# Patient Record
Sex: Female | Born: 1985
Health system: Southern US, Community
[De-identification: ages and names within clinical notes are randomized; demographics above are authoritative.]

## PROBLEM LIST (undated history)

## (undated) DIAGNOSIS — R87629 Unspecified abnormal cytological findings in specimens from vagina: Secondary | ICD-10-CM

## (undated) DIAGNOSIS — F329 Major depressive disorder, single episode, unspecified: Secondary | ICD-10-CM

## (undated) DIAGNOSIS — F419 Anxiety disorder, unspecified: Secondary | ICD-10-CM

## (undated) DIAGNOSIS — Z975 Presence of (intrauterine) contraceptive device: Secondary | ICD-10-CM

## (undated) DIAGNOSIS — G43909 Migraine, unspecified, not intractable, without status migrainosus: Secondary | ICD-10-CM

## (undated) DIAGNOSIS — F32A Depression, unspecified: Secondary | ICD-10-CM

## (undated) DIAGNOSIS — N879 Dysplasia of cervix uteri, unspecified: Secondary | ICD-10-CM

## (undated) HISTORY — DX: Presence of (intrauterine) contraceptive device: Z97.5

## (undated) HISTORY — DX: Dysplasia of cervix uteri, unspecified: N87.9

## (undated) HISTORY — DX: Anxiety disorder, unspecified: F41.9

## (undated) HISTORY — DX: Unspecified abnormal cytological findings in specimens from vagina: R87.629

## (undated) HISTORY — DX: Major depressive disorder, single episode, unspecified: F32.9

## (undated) HISTORY — DX: Depression, unspecified: F32.A

## (undated) HISTORY — DX: Migraine, unspecified, not intractable, without status migrainosus: G43.909

---

## 2005-05-10 DIAGNOSIS — N879 Dysplasia of cervix uteri, unspecified: Secondary | ICD-10-CM

## 2005-05-10 HISTORY — DX: Dysplasia of cervix uteri, unspecified: N87.9

## 2005-05-10 HISTORY — PX: OTHER SURGICAL HISTORY: SHX169

## 2008-05-10 HISTORY — PX: WISDOM TOOTH EXTRACTION: SHX21

## 2013-06-22 ENCOUNTER — Encounter: Payer: Self-pay | Admitting: Family Medicine

## 2013-06-22 ENCOUNTER — Ambulatory Visit (INDEPENDENT_AMBULATORY_CARE_PROVIDER_SITE_OTHER): Payer: Managed Care, Other (non HMO) | Admitting: Family Medicine

## 2013-06-22 VITALS — BP 119/79 | HR 111 | Ht 67.0 in | Wt 160.0 lb

## 2013-06-22 DIAGNOSIS — Z87410 Personal history of cervical dysplasia: Secondary | ICD-10-CM

## 2013-06-22 DIAGNOSIS — N939 Abnormal uterine and vaginal bleeding, unspecified: Secondary | ICD-10-CM

## 2013-06-22 DIAGNOSIS — Z975 Presence of (intrauterine) contraceptive device: Secondary | ICD-10-CM

## 2013-06-22 DIAGNOSIS — Z131 Encounter for screening for diabetes mellitus: Secondary | ICD-10-CM

## 2013-06-22 DIAGNOSIS — Z13220 Encounter for screening for lipoid disorders: Secondary | ICD-10-CM

## 2013-06-22 DIAGNOSIS — R110 Nausea: Secondary | ICD-10-CM

## 2013-06-22 DIAGNOSIS — N898 Other specified noninflammatory disorders of vagina: Secondary | ICD-10-CM

## 2013-06-22 HISTORY — DX: Presence of (intrauterine) contraceptive device: Z97.5

## 2013-06-22 LAB — POCT URINE PREGNANCY: PREG TEST UR: NEGATIVE

## 2013-06-22 MED ORDER — ONDANSETRON HCL 4 MG PO TABS: 4.0000 mg | ORAL_TABLET | Freq: Three times a day (TID) | ORAL | Status: DC | PRN

## 2013-06-22 NOTE — Progress Notes (Signed)
CC: Tommas OlpJessica Leeman is a 28 y.o. female is here for Establish Care and Nausea   Subjective: HPI:  Very pleasant 28 year old here to establish care  Patient reports a history of cervical dysplasia in 2007 that required coned cryotherapy she has had unremarkable Pap smears since then most notably have Pap smears every 6 months x4. Most recent Pap smear was in January 2014 and normal per her report.  Today she complains of nausea of mild severity that has been persistent throughout the day not interfering with appetite. Nothing particular has made it better or worse. Described only as upset stomach without any abdominal pain whatsoever. Denies any vomiting, diarrhea, constipation, fevers, chills nor rashes. Denies any genitourinary complaints other than below. This is also been accompanied by headache that is mild in severity localized to the forehead nothing which makes better or worse took Aleve this morning however no other interventions. Denies any motor sensory disturbances accompanying this  She mentions that she has had vaginal spotting lasting 3-5 days once a month ever since her Mirena was placed last year. It is predictable and not interfering with her quality of life.   Review Of Systems Outlined In HPI  Past Medical History  Diagnosis Date  . Dysplasia of cervix 2007    Past Surgical History  Procedure Laterality Date  . Cesarean section  2006,2007    x 2  . Wisdom tooth extraction  2010  . Procedure for dysplasia  2007   Family History  Problem Relation Age of Onset  . Cancer Maternal Grandmother   . Heart attack      parent  . Diabetes      parents  . Hypertension      paternal grandmother  . Hyperlipidemia      paternal granmother    History   Social History  . Marital Status: Married    Spouse Name: N/A    Number of Children: N/A  . Years of Education: N/A   Occupational History  . Not on file.   Social History Main Topics  . Smoking status: Never Smoker    . Smokeless tobacco: Not on file  . Alcohol Use: No  . Drug Use: No  . Sexual Activity: Yes    Partners: Male   Other Topics Concern  . Not on file   Social History Narrative  . No narrative on file     Objective: BP 119/79  Pulse 111  Ht 5\' 7"  (1.702 m)  Wt 160 lb (72.576 kg)  BMI 25.05 kg/m2  General: Alert and Oriented, No Acute Distress HEENT: Pupils equal, round, reactive to light. Conjunctivae clear.  External ears unremarkable, canals clear with intact TMs with appropriate landmarks.  Middle ear appears open without effusion. Pink inferior turbinates.  Moist mucous membranes, pharynx without inflammation nor lesions.  Neck supple without palpable lymphadenopathy nor abnormal masses. Neuro: Cranial nerves II through XII grossly intact Lungs: Clear to auscultation bilaterally, no wheezing/ronchi/rales.  Comfortable work of breathing. Good air movement. Cardiac: Regular rate and rhythm. Normal S1/S2.  No murmurs, rubs, nor gallops.   Mental Status: No depression, anxiety, nor agitation. Skin: Warm and dry.  Assessment & Plan: [name] was seen today for establish care and nausea.  Diagnoses and associated orders for this visit:  Vaginal bleeding - POCT urine pregnancy  History of cervical dysplasia  IUD (intrauterine device) in place  Lipid screening - Lipid panel  Nausea alone - ondansetron (ZOFRAN) 4 MG tablet; Take 1-2 tablets (4-8  mg total) by mouth every 8 (eight) hours as needed for nausea or vomiting.  Diabetes mellitus screening - BASIC METABOLIC PANEL WITH GFR    Vaginal bleeding in the setting of nausea process to get a urine pregnancy test today that was negative. We discussed that with the Mirena is not uncommon for women to have unpredictable or predictable vaginal bleeding. I do not feel this is a sign of needing any further workup other than a Pap smear when she returns for her annual exam. Nausea: Discuss symptomatically her including Zofran  one dose she received here in clinic 8 mg, prescription to use at home.  She's going to get basic metabolic panel for further workup. Headache: Given ibuprofen here 800 mg, continue this every 8 hours as needed call if any new symptoms develop over the weekend or on Monday. Signs and symptoms requring emergent/urgent reevaluation were discussed with the patient.    Return if symptoms worsen or fail to improve, for PRN Annual Exam.

## 2013-06-25 LAB — LIPID PANEL
Cholesterol: 143 mg/dL (ref 0–200)
HDL: 38 mg/dL — AB (ref >39)
LDL CALC: 95 mg/dL (ref 0–99)
Total CHOL/HDL Ratio: 3.8 Ratio
Triglycerides: 50 mg/dL (ref <150)
VLDL: 10 mg/dL (ref 0–40)

## 2013-06-25 LAB — BASIC METABOLIC PANEL WITH GFR
BUN: 11 mg/dL (ref 6–23)
CO2: 26 meq/L (ref 19–32)
Calcium: 9.2 mg/dL (ref 8.4–10.5)
Chloride: 108 mEq/L (ref 96–112)
Creat: 0.81 mg/dL (ref 0.50–1.10)
GFR, Est African American: >89 mL/min
GFR, Est Non African American: >89 mL/min
Glucose, Bld: 89 mg/dL (ref 70–99)
Potassium: 4 mEq/L (ref 3.5–5.3)
Sodium: 143 mEq/L (ref 135–145)

## 2013-06-27 ENCOUNTER — Encounter: Payer: Self-pay | Admitting: Family Medicine

## 2013-07-02 ENCOUNTER — Encounter: Payer: Self-pay | Admitting: Family Medicine

## 2013-07-02 DIAGNOSIS — Z9289 Personal history of other medical treatment: Secondary | ICD-10-CM | POA: Insufficient documentation

## 2013-07-13 ENCOUNTER — Encounter: Payer: Self-pay | Admitting: Family Medicine

## 2013-07-13 ENCOUNTER — Other Ambulatory Visit (HOSPITAL_COMMUNITY)
Admission: RE | Admit: 2013-07-13 | Discharge: 2013-07-13 | Disposition: A | Discharge status: Home / Self Care | Payer: Managed Care, Other (non HMO) | Source: Ambulatory Visit | Attending: Family Medicine | Admitting: Family Medicine

## 2013-07-13 ENCOUNTER — Ambulatory Visit (INDEPENDENT_AMBULATORY_CARE_PROVIDER_SITE_OTHER): Payer: Managed Care, Other (non HMO) | Admitting: Family Medicine

## 2013-07-13 VITALS — BP 115/75 | HR 87 | Resp 16 | Wt 159.0 lb

## 2013-07-13 DIAGNOSIS — Z0000 Encounter for general adult medical examination without abnormal findings: Secondary | ICD-10-CM

## 2013-07-13 DIAGNOSIS — Z205 Contact with and (suspected) exposure to viral hepatitis: Secondary | ICD-10-CM

## 2013-07-13 DIAGNOSIS — Z87410 Personal history of cervical dysplasia: Secondary | ICD-10-CM

## 2013-07-13 DIAGNOSIS — Z01419 Encounter for gynecological examination (general) (routine) without abnormal findings: Secondary | ICD-10-CM | POA: Insufficient documentation

## 2013-07-13 DIAGNOSIS — Z23 Encounter for immunization: Secondary | ICD-10-CM

## 2013-07-13 NOTE — Patient Instructions (Signed)
Dr. Breyon Sigg's General Advice Following Your Complete Physical Exam  The Benefits of Regular Exercise: Unless you suffer from an uncontrolled cardiovascular condition, studies strongly suggest that regular exercise and physical activity will add to both the quality and length of your life.  The World Health Organization recommends 150 minutes of moderate intensity aerobic activity every week.  This is best split over 3-4 days a week, and can be as simple as a brisk walk for just over 35 minutes "most days of the week".  This type of exercise has been shown to lower LDL-Cholesterol, lower average blood sugars, lower blood pressure, lower cardiovascular disease risk, improve memory, and increase one's overall sense of wellbeing.  The addition of anaerobic (or "strength training") exercises offers additional benefits including but not limited to increased metabolism, prevention of osteoporosis, and improved overall cholesterol levels.  How Can I Strive For A Low-Fat Diet?: Current guidelines recommend that 25-35 percent of your daily energy (food) intake should come from fats.  One might ask how can this be achieved without having to dissect each meal on a daily basis?  Switch to skim or 1% milk instead of whole milk.  Focus on lean meats such as ground turkey, fresh fish, baked chicken, and lean cuts of beef as your source of dietary protein.  Limit saturated fat consumption to less than 10% of your daily caloric intake.  Limit trans fatty acid consumption primarily by limiting synthetic trans fats such as partially hydrogenated oils (Ex: fried fast foods).  Substitute olive or vegetable oil for solid fats where possible.  Moderation of Salt Intake: Provided you don't carry a diagnosis of congestive heart failure nor renal failure, I recommend a daily allowance of no more than 2300 mg of salt (sodium).  Keeping under this daily goal is associated with a decreased risk of cardiovascular events, creeping  above it can lead to elevated blood pressures and increases your risk of cardiovascular events.  Milligrams (mg) of salt is listed on all nutrition labels, and your daily intake can add up faster than you think.  Most canned and frozen dinners can pack in over half your daily salt allowance in one meal.    Lifestyle Health Risks: Certain lifestyle choices carry specific health risks.  As you may already know, tobacco use has been associated with increasing one's risk of cardiovascular disease, pulmonary disease, numerous cancers, among many other issues.  What you may not know is that there are medications and nicotine replacement strategies that can more than double your chances of successfully quitting.  I would be thrilled to help manage your quitting strategy if you currently use tobacco products.  When it comes to alcohol use, I've yet to find an "ideal" daily allowance.  Provided an individual does not have a medical condition that is exacerbated by alcohol consumption, general guidelines determine "safe drinking" as no more than two standard drinks for a man or no more than one standard drink for a female per day.  However, much debate still exists on whether any amount of alcohol consumption is technically "safe".  My general advice, keep alcohol consumption to a minimum for general health promotion.  If you or others believe that alcohol, tobacco, or recreational drug use is interfering with your life, I would be happy to provide confidential counseling regarding treatment options.  General "Over The Counter" Nutrition Advice: Postmenopausal women should aim for a daily calcium intake of 1200 mg, however a significant portion of this might already be   provided by diets including milk, yogurt, cheese, and other dairy products.  Vitamin D has been shown to help preserve bone density, prevent fatigue, and has even been shown to help reduce falls in the elderly.  Ensuring a daily intake of 800 Units of  Vitamin D is a good place to start to enjoy the above benefits, we can easily check your Vitamin D level to see if you'd potentially benefit from supplementation beyond 800 Units a day.  Folic Acid intake should be of particular concern to women of childbearing age.  Daily consumption of 400-800 mcg of Folic Acid is recommended to minimize the chance of spinal cord defects in a fetus should pregnancy occur.    For many adults, accidents still remain one of the most common culprits when it comes to cause of death.  Some of the simplest but most effective preventitive habits you can adopt include regular seatbelt use, proper helmet use, securing firearms, and regularly testing your smoke and carbon monoxide detectors.  Mary Mcpherson B. Mary Marengo DO [facility] [address], [address] [place], [place] [address] Phone: [phone]  

## 2013-07-13 NOTE — Progress Notes (Signed)
CC: Mary Mcpherson is a 28 y.o. 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 She does not know of any exposure to infected patient's blood or bodily secretions. The exposure has only been skin the skin or handling papers that have been touched by the above. She denies any specific symptoms attributed to concerns for hepatitis.  Exercises most days of the week tries to watch what she eats rare alcohol use no tobacco or recreational drug use  Review of Systems - General ROS: negative for - chills, fever, night sweats, weight gain or weight loss Ophthalmic ROS: negative for - decreased vision Psychological ROS: negative for - anxiety or depression ENT ROS: negative for - hearing change, nasal congestion, tinnitus or allergies Hematological and Lymphatic ROS: negative for - bleeding problems, bruising or swollen lymph nodes Breast ROS: negative Respiratory ROS: no cough, shortness of breath, or wheezing Cardiovascular ROS: no chest pain or dyspnea on exertion Gastrointestinal ROS: no abdominal pain, change in bowel habits, or black or bloody stools Genito-Urinary ROS: negative for - genital discharge, genital ulcers, incontinence or abnormal bleeding from genitals Musculoskeletal ROS: negative for - joint pain or muscle pain Neurological ROS: negative for - headaches or memory loss Dermatological ROS: negative for lumps, mole changes, rash and skin lesion changes   Past Medical History  Diagnosis Date  . Dysplasia of cervix 2007    Past Surgical History   Procedure Laterality Date  . Cesarean section  2006,2007    x 2  . Wisdom tooth extraction  2010  . Procedure for dysplasia  2007   Family History  Problem Relation Age of Onset  . Cancer Maternal Grandmother   . Heart attack      parent  . Diabetes      parents  . Hypertension      paternal grandmother  . Hyperlipidemia      paternal granmother    History   Social History  . Marital Status: Married    Spouse Name: N/A    Number of Children: N/A  . Years of Education: N/A   Occupational History  . Not on file.   Social History Main Topics  . Smoking status: Never Smoker   . Smokeless tobacco: Not on file  . Alcohol Use: No  . Drug Use: No  . Sexual Activity: Yes    Partners: Male   Other Topics Concern  . Not on file   Social History Narrative  . No narrative on file     Objective: BP 115/75  Pulse 87  Resp 16  Wt 159 lb (72.122 kg)  SpO2 99%  General: No Acute Distress HEENT: Atraumatic, normocephalic, conjunctivae normal without scleral icterus.  No nasal discharge, hearing grossly intact, TMs with good landmarks bilaterally with no middle ear abnormalities, posterior pharynx clear without oral lesions. Neck: Supple, trachea midline, no cervical nor supraclavicular adenopathy. Pulmonary: Clear to auscultation bilaterally without wheezing, rhonchi, nor rales. Cardiac: Regular rate and rhythm.  No murmurs, rubs, nor gallops. No peripheral edema.  2+ peripheral pulses bilaterally. Abdomen: Bowel sounds normal.  No  masses.  Non-tender without rebound.  Negative Murphy's sign. GU: Labia majora & minora without lesions.  Distal urethra unremarkable.  Vaginal wall integrity preserved without mucosal lesions.  Cervix non-tender and without gross lesions nor discharge.  Mirena strings in place exiting external cervical os MSK: Grossly intact, no signs of weakness.  Full strength throughout upper and lower extremities.  Full ROM in upper and lower extremities.  No  midline spinal tenderness. Neuro: Gait unremarkable, CN II-XII grossly intact.  C5-C6 Reflex 2/4 Bilaterally, L4 Reflex 2/4 Bilaterally.  Cerebellar function intact. Skin: No rashes. Psych: Alert and oriented to person/place/time.  Thought process normal. No anxiety/depression.   Assessment & Plan: Mary Mcpherson was seen today for annual exam.  Diagnoses and associated orders for this visit:  Annual physical exam  Need for Tdap vaccination - Tdap vaccine greater than or equal to 7yo IM  History of cervical dysplasia  Exposure to viral hepatitis - Hepatitis B Surface AntiBODY - Hepatitis C antibody  Encounter for routine gynecological examination - Cytology - PAP    Healthy lifestyle interventions including but not limited to regular exercise, a healthy low fat diet, moderation of salt intake, the dangers of tobacco/alcohol/recreational drug use, nutrition supplementation, and accident avoidance were discussed with the patient and a handout was provided for future reference. Pap smear obtained without difficulty Her concerns we will check for evidence of hepatitis C infection and preservation of hepatitis B immunization   Return in about 1 year (around 07/14/2014) for Annual Exams.

## 2013-07-14 LAB — HEPATITIS B SURFACE ANTIBODY,QUALITATIVE: Hep B S Ab: POSITIVE — AB

## 2013-07-14 LAB — HEPATITIS C ANTIBODY: HCV Ab: NEGATIVE

## 2014-03-01 ENCOUNTER — Encounter: Payer: Self-pay | Admitting: Family Medicine

## 2014-03-01 ENCOUNTER — Ambulatory Visit (INDEPENDENT_AMBULATORY_CARE_PROVIDER_SITE_OTHER): Payer: Managed Care, Other (non HMO) | Admitting: Family Medicine

## 2014-03-01 VITALS — BP 119/87 | HR 102 | Temp 97.8°F | Wt 164.0 lb

## 2014-03-01 DIAGNOSIS — B029 Zoster without complications: Secondary | ICD-10-CM

## 2014-03-01 DIAGNOSIS — F411 Generalized anxiety disorder: Secondary | ICD-10-CM | POA: Insufficient documentation

## 2014-03-01 HISTORY — DX: Zoster without complications: B02.9

## 2014-03-01 MED ORDER — LIDOCAINE 5 % EX OINT: 1.0000 "application " | TOPICAL_OINTMENT | CUTANEOUS | Status: DC | PRN

## 2014-03-01 MED ORDER — ACYCLOVIR 400 MG PO TABS: 800.0000 mg | ORAL_TABLET | Freq: Every day | ORAL | Status: DC

## 2014-03-01 MED ORDER — PREDNISONE 20 MG PO TABS: ORAL_TABLET | ORAL | Status: AC

## 2014-03-01 NOTE — Progress Notes (Signed)
CC: Mary Mcpherson is a 28 y.o. female is here for rash on abd and Nausea   Subjective: HPI:  Patient complains of a rash on the abdomen has been present for since Tuesday of this week. It was initially itchy however now it is painful to touch. Nothing else seems to make it better or worse. It seems to be spreading around her flank and has lesions on the back. This is accompanied by fever, abdominal discomfort, sore throat that began 2 days prior to the outbreak of the rash however these have now resolved without any intervention. She denies skin lesions elsewhere.  Complains of moderate nervousness and anxiety for reasons she can't identify. This is been going on ever since one of her coworkers got in trouble for calling in prescriptions for Xanax using Verdis's name as a Engineer, civil (consulting)nurse. The judicial system and her administrators have not place any blame on [name] and there have been no repercussions for her however she is embarrassed and is afraid to have this linked to her for the rest of her life. She reports the anxiousness and nervousness has been present on a daily basis, having a short fuse, and causing her to get emotional and crying over simple stressful episodes. Since August it has not gotten better or worse. She denies any other mental disturbance, dissatisfaction with life, depression or sleep disturbance  Review Of Systems Outlined In HPI  Past Medical History  Diagnosis Date  . Dysplasia of cervix 2007    Past Surgical History  Procedure Laterality Date  . Cesarean section  2006,2007    x 2  . Wisdom tooth extraction  2010  . Procedure for dysplasia  2007   Family History  Problem Relation Age of Onset  . Cancer Maternal Grandmother   . Heart attack      parent  . Diabetes      parents  . Hypertension      paternal grandmother  . Hyperlipidemia      paternal granmother    History   Social History  . Marital Status: Married    Spouse Name: N/A    Number of Children: N/A   . Years of Education: N/A   Occupational History  . Not on file.   Social History Main Topics  . Smoking status: Never Smoker   . Smokeless tobacco: Not on file  . Alcohol Use: No  . Drug Use: No  . Sexual Activity: Yes    Partners: Male   Other Topics Concern  . Not on file   Social History Narrative  . No narrative on file     Objective: BP 119/87  Pulse 102  Temp(Src) 97.8 F (36.6 C) (Oral)  Wt 164 lb (74.39 kg)  General: Alert and Oriented, No Acute Distress HEENT: Pupils equal, round, reactive to light. Conjunctivae clear.  Moist membranes pharynx unremarkable Lungs: Clear to auscultation bilaterally, no wheezing/ronchi/rales.  Comfortable work of breathing. Good air movement. Cardiac: Regular rate and rhythm. Normal S1/S2.  No murmurs, rubs, nor gallops.   Abdomen: Soft nontender Extremities: No peripheral edema.  Strong peripheral pulses.  Mental Status: No depression, anxiety, nor agitation. Skin: Warm and dry. Clusters of fluid-filled vesicles in the approximate T8 dermatome on the left extending from the abdomen wrapped around the flank to the back. Tender to the touch  Assessment & Plan: [name] was seen today for rash on abd and nausea.  Diagnoses and associated orders for this visit:  Shingles - acyclovir (ZOVIRAX) 400  MG tablet; Take 2 tablets (800 mg total) by mouth 5 (five) times daily. - predniSONE (DELTASONE) 20 MG tablet; Three tabs daily days 1-3, two tabs daily days 4-6, one tab daily days 7-9, half tab daily days 10-13. - lidocaine (XYLOCAINE) 5 % ointment; Apply 1 application topically as needed.  Generalized anxiety disorder    Shingles: Start acyclovir and prednisone taper. Lidocaine ointment as needed for pain. She declines any pain medication today beyond this Generalized anxiety disorder: Offered SNRI/SSRI today however she would prefer to avoid taking medications at all possible and would rather take a wait and see approach.  Return  if symptoms worsen or fail to improve.

## 2014-03-04 ENCOUNTER — Ambulatory Visit: Payer: Managed Care, Other (non HMO) | Admitting: Family Medicine

## 2014-07-29 ENCOUNTER — Encounter: Payer: Self-pay | Admitting: Family Medicine

## 2014-07-29 ENCOUNTER — Ambulatory Visit (INDEPENDENT_AMBULATORY_CARE_PROVIDER_SITE_OTHER): Payer: Managed Care, Other (non HMO) | Admitting: Family Medicine

## 2014-07-29 ENCOUNTER — Other Ambulatory Visit (HOSPITAL_COMMUNITY)
Admission: RE | Admit: 2014-07-29 | Discharge: 2014-07-29 | Disposition: A | Discharge status: Home / Self Care | Payer: Managed Care, Other (non HMO) | Source: Ambulatory Visit | Attending: Family Medicine | Admitting: Family Medicine

## 2014-07-29 VITALS — BP 115/69 | HR 80 | Ht 67.0 in | Wt 173.0 lb

## 2014-07-29 DIAGNOSIS — Z1151 Encounter for screening for human papillomavirus (HPV): Secondary | ICD-10-CM | POA: Insufficient documentation

## 2014-07-29 DIAGNOSIS — Z01419 Encounter for gynecological examination (general) (routine) without abnormal findings: Secondary | ICD-10-CM | POA: Diagnosis present

## 2014-07-29 DIAGNOSIS — Z124 Encounter for screening for malignant neoplasm of cervix: Secondary | ICD-10-CM

## 2014-07-29 DIAGNOSIS — Z0000 Encounter for general adult medical examination without abnormal findings: Secondary | ICD-10-CM

## 2014-07-29 LAB — LIPID PANEL
Cholesterol: 173 mg/dL (ref 0–200)
HDL: 52 mg/dL (ref >=46)
LDL Cholesterol: 110 mg/dL — ABNORMAL HIGH (ref 0–99)
Total CHOL/HDL Ratio: 3.3 Ratio
Triglycerides: 57 mg/dL (ref <150)
VLDL: 11 mg/dL (ref 0–40)

## 2014-07-29 LAB — CBC
HEMATOCRIT: 38.9 % (ref 36.0–46.0)
HEMOGLOBIN: 13.5 g/dL (ref 12.0–15.0)
MCH: 31.5 pg (ref 26.0–34.0)
MCHC: 34.7 g/dL (ref 30.0–36.0)
MCV: 90.7 fL (ref 78.0–100.0)
MPV: 10.4 fL (ref 8.6–12.4)
Platelets: 267 10*3/uL (ref 150–400)
RBC: 4.29 MIL/uL (ref 3.87–5.11)
RDW: 13.3 % (ref 11.5–15.5)
WBC: 5.1 10*3/uL (ref 4.0–10.5)

## 2014-07-29 LAB — COMPREHENSIVE METABOLIC PANEL
ALBUMIN: 4.6 g/dL (ref 3.5–5.2)
ALT: 11 U/L (ref 0–35)
AST: 13 U/L (ref 0–37)
Alkaline Phosphatase: 49 U/L (ref 39–117)
BILIRUBIN TOTAL: 0.7 mg/dL (ref 0.2–1.2)
BUN: 14 mg/dL (ref 6–23)
CO2: 26 mEq/L (ref 19–32)
Calcium: 9.3 mg/dL (ref 8.4–10.5)
Chloride: 107 mEq/L (ref 96–112)
Creat: 0.78 mg/dL (ref 0.50–1.10)
Glucose, Bld: 84 mg/dL (ref 70–99)
POTASSIUM: 4.1 meq/L (ref 3.5–5.3)
SODIUM: 140 meq/L (ref 135–145)
TOTAL PROTEIN: 7.2 g/dL (ref 6.0–8.3)

## 2014-07-29 NOTE — Progress Notes (Signed)
CC: Mary Mcpherson is a 29 y.o. female is here for Annual Exam   Subjective: HPI:  Colonoscopy: No indication yet Papsmear: She has a history of CIN in 2007 with no abnormal since, however she would like to have a Pap smear today Mammogram: no current indication  Influenza Vaccine: Declines today Pneumovax: No current indication Td/Tdap:UTD as of last year Zoster: (Start 30 yo)  Rare alcohol use no tobacco or recreational drug use  Review of Systems - General ROS: negative for - chills, fever, night sweats, weight gain or weight loss Ophthalmic ROS: negative for - decreased vision Psychological ROS: negative for - anxiety or depression ENT ROS: negative for - hearing change, nasal congestion, tinnitus or allergies Hematological and Lymphatic ROS: negative for - bleeding problems, bruising or swollen lymph nodes Breast ROS: negative Respiratory ROS: no cough, shortness of breath, or wheezing Cardiovascular ROS: no chest pain or dyspnea on exertion Gastrointestinal ROS: no abdominal pain, change in bowel habits, or black or bloody stools Genito-Urinary ROS: negative for - genital discharge, genital ulcers, incontinence or abnormal bleeding from genitals Musculoskeletal ROS: negative for - joint pain or muscle pain Neurological ROS: negative for - headaches or memory loss Dermatological ROS: negative for lumps, mole changes, rash and skin lesion changes  Past Medical History  Diagnosis Date  . Dysplasia of cervix 2007    Past Surgical History  Procedure Laterality Date  . Cesarean section  2006,2007    x 2  . Wisdom tooth extraction  2010  . Procedure for dysplasia  2007   Family History  Problem Relation Age of Onset  . Cancer Maternal Grandmother   . Heart attack      parent  . Diabetes      parents  . Hypertension      paternal grandmother  . Hyperlipidemia      paternal granmother    History   Social History  . Marital Status: Married    Spouse Name: N/A  .  Number of Children: N/A  . Years of Education: N/A   Occupational History  . Not on file.   Social History Main Topics  . Smoking status: Never Smoker   . Smokeless tobacco: Not on file  . Alcohol Use: No  . Drug Use: No  . Sexual Activity:    Partners: Male   Other Topics Concern  . Not on file   Social History Narrative     Objective: BP 115/69 mmHg  Pulse 80  Ht '5\' 7"'  (1.702 m)  Wt 173 lb (78.472 kg)  BMI 27.09 kg/m2  General: No Acute Distress HEENT: Atraumatic, normocephalic, conjunctivae normal without scleral icterus.  No nasal discharge, hearing grossly intact, TMs with good landmarks bilaterally with no middle ear abnormalities, posterior pharynx clear without oral lesions. Neck: Supple, trachea midline, no cervical nor supraclavicular adenopathy. Pulmonary: Clear to auscultation bilaterally without wheezing, rhonchi, nor rales. Cardiac: Regular rate and rhythm.  No murmurs, rubs, nor gallops. No peripheral edema.  2+ peripheral pulses bilaterally. Abdomen: Bowel sounds normal.  No masses.  Non-tender without rebound.  Negative Murphy's sign. GU:  Labia majora & minora without lesions.  Distal urethra unremarkable.  Vaginal wall integrity preserved without mucosal lesions.  Cervix non-tender and without gross lesions nor discharge.  IUD strings easily seen at cervical os. MSK: Grossly intact, no signs of weakness.  Full strength throughout upper and lower extremities.  Full ROM in upper and lower extremities.  No midline spinal tenderness. Neuro: Gait unremarkable,  CN II-XII grossly intact.  C5-C6 Reflex 2/4 Bilaterally, L4 Reflex 2/4 Bilaterally.  Cerebellar function intact. Skin: No rashes. Psych: Alert and oriented to person/place/time.  Thought process normal. No anxiety/depression.   Assessment & Plan: Mary Mcpherson was seen today for annual exam.  Diagnoses and all orders for this visit:  Annual physical exam Orders: -     Lipid panel -     Comp Met (CMET) -      CBC  Screening for cervical cancer   Healthy lifestyle interventions including but not limited to regular exercise, a healthy low fat diet, moderation of salt intake, the dangers of tobacco/alcohol/recreational drug use, nutrition supplementation, and accident avoidance were discussed with the patient and a handout was provided for future reference.  Return in about 1 year (around 07/29/2015). j

## 2014-07-29 NOTE — Patient Instructions (Signed)
Dr. Mitsuye Schrodt's General Advice Following Your Complete Physical Exam  The Benefits of Regular Exercise: Unless you suffer from an uncontrolled cardiovascular condition, studies strongly suggest that regular exercise and physical activity will add to both the quality and length of your life.  The World Health Organization recommends 150 minutes of moderate intensity aerobic activity every week.  This is best split over 3-4 days a week, and can be as simple as a brisk walk for just over 35 minutes "most days of the week".  This type of exercise has been shown to lower LDL-Cholesterol, lower average blood sugars, lower blood pressure, lower cardiovascular disease risk, improve memory, and increase one's overall sense of wellbeing.  The addition of anaerobic (or "strength training") exercises offers additional benefits including but not limited to increased metabolism, prevention of osteoporosis, and improved overall cholesterol levels.  How Can I Strive For A Low-Fat Diet?: Current guidelines recommend that 25-35 percent of your daily energy (food) intake should come from fats.  One might ask how can this be achieved without having to dissect each meal on a daily basis?  Switch to skim or 1% milk instead of whole milk.  Focus on lean meats such as ground turkey, fresh fish, baked chicken, and lean cuts of beef as your source of dietary protein.  Limit saturated fat consumption to less than 10% of your daily caloric intake.  Limit trans fatty acid consumption primarily by limiting synthetic trans fats such as partially hydrogenated oils (Ex: fried fast foods).  Substitute olive or vegetable oil for solid fats where possible.  Moderation of Salt Intake: Provided you don't carry a diagnosis of congestive heart failure nor renal failure, I recommend a daily allowance of no more than 2300 mg of salt (sodium).  Keeping under this daily goal is associated with a decreased risk of cardiovascular events, creeping  above it can lead to elevated blood pressures and increases your risk of cardiovascular events.  Milligrams (mg) of salt is listed on all nutrition labels, and your daily intake can add up faster than you think.  Most canned and frozen dinners can pack in over half your daily salt allowance in one meal.    Lifestyle Health Risks: Certain lifestyle choices carry specific health risks.  As you may already know, tobacco use has been associated with increasing one's risk of cardiovascular disease, pulmonary disease, numerous cancers, among many other issues.  What you may not know is that there are medications and nicotine replacement strategies that can more than double your chances of successfully quitting.  I would be thrilled to help manage your quitting strategy if you currently use tobacco products.  When it comes to alcohol use, I've yet to find an "ideal" daily allowance.  Provided an individual does not have a medical condition that is exacerbated by alcohol consumption, general guidelines determine "safe drinking" as no more than two standard drinks for a man or no more than one standard drink for a female per day.  However, much debate still exists on whether any amount of alcohol consumption is technically "safe".  My general advice, keep alcohol consumption to a minimum for general health promotion.  If you or others believe that alcohol, tobacco, or recreational drug use is interfering with your life, I would be happy to provide confidential counseling regarding treatment options.  General "Over The Counter" Nutrition Advice: Postmenopausal women should aim for a daily calcium intake of 1200 mg, however a significant portion of this might already be   provided by diets including milk, yogurt, cheese, and other dairy products.  Vitamin D has been shown to help preserve bone density, prevent fatigue, and has even been shown to help reduce falls in the elderly.  Ensuring a daily intake of 800 Units of  Vitamin D is a good place to start to enjoy the above benefits, we can easily check your Vitamin D level to see if you'd potentially benefit from supplementation beyond 800 Units a day.  Folic Acid intake should be of particular concern to women of childbearing age.  Daily consumption of 400-800 mcg of Folic Acid is recommended to minimize the chance of spinal cord defects in a fetus should pregnancy occur.    For many adults, accidents still remain one of the most common culprits when it comes to cause of death.  Some of the simplest but most effective preventitive habits you can adopt include regular seatbelt use, proper helmet use, securing firearms, and regularly testing your smoke and carbon monoxide detectors.  Mary Shrieves B. Elleah Hemsley DO [facility] [address], [address] [place], [place] [address] Phone: [phone]  

## 2014-07-29 NOTE — Addendum Note (Signed)
Addended by: Wyline BeadyMCCRIMMON, Keiara Sneeringer C on: 07/29/2014 09:44 AM   Modules accepted: Orders

## 2014-07-30 LAB — CYTOLOGY - PAP

## 2014-07-31 ENCOUNTER — Telehealth: Payer: Self-pay | Admitting: *Deleted

## 2014-07-31 NOTE — Telephone Encounter (Signed)
Patient called in this AM with complaint of UTI symptoms that has progressed overnight. Since leaving message she has picked some OTC products. Patient will call if no relief.

## 2014-11-05 ENCOUNTER — Encounter: Payer: Self-pay | Admitting: Family Medicine

## 2014-11-05 MED ORDER — ESCITALOPRAM OXALATE 10 MG PO TABS: 10.0000 mg | ORAL_TABLET | Freq: Every day | ORAL | Status: DC

## 2014-11-06 MED ORDER — HYDROXYZINE HCL 25 MG PO TABS: 25.0000 mg | ORAL_TABLET | Freq: Every day | ORAL | Status: DC | PRN

## 2014-11-06 NOTE — Addendum Note (Signed)
Addended by: Laren BoomHOMMEL, Jaisean Monteforte on: 11/06/2014 08:12 AM   Modules accepted: Orders, Medications

## 2014-11-27 ENCOUNTER — Encounter: Payer: Self-pay | Admitting: Family Medicine

## 2015-01-14 ENCOUNTER — Encounter: Payer: Self-pay | Admitting: Family Medicine

## 2015-01-15 MED ORDER — VALACYCLOVIR HCL 1 G PO TABS: 1000.0000 mg | ORAL_TABLET | Freq: Three times a day (TID) | ORAL | Status: DC

## 2015-04-14 ENCOUNTER — Ambulatory Visit (INDEPENDENT_AMBULATORY_CARE_PROVIDER_SITE_OTHER): Payer: Managed Care, Other (non HMO) | Admitting: Family Medicine

## 2015-04-14 ENCOUNTER — Encounter: Payer: Self-pay | Admitting: Family Medicine

## 2015-04-14 VITALS — BP 124/79 | HR 74 | Wt 175.0 lb

## 2015-04-14 DIAGNOSIS — G43819 Other migraine, intractable, without status migrainosus: Secondary | ICD-10-CM

## 2015-04-14 DIAGNOSIS — L679 Hair color and hair shaft abnormality, unspecified: Secondary | ICD-10-CM

## 2015-04-14 DIAGNOSIS — G43909 Migraine, unspecified, not intractable, without status migrainosus: Secondary | ICD-10-CM | POA: Insufficient documentation

## 2015-04-14 MED ORDER — METHYLPREDNISOLONE ACETATE 80 MG/ML IJ SUSP
80.0000 mg | Freq: Once | INTRAMUSCULAR | Status: AC
  Administered 2015-04-14: 80 mg via INTRAMUSCULAR

## 2015-04-14 MED ORDER — PROMETHAZINE HCL 25 MG/ML IJ SOLN
25.0000 mg | Freq: Once | INTRAMUSCULAR | Status: AC
  Administered 2015-04-14: 25 mg via INTRAMUSCULAR

## 2015-04-14 MED ORDER — KETOROLAC TROMETHAMINE 60 MG/2ML IM SOLN
60.0000 mg | Freq: Once | INTRAMUSCULAR | Status: AC
  Administered 2015-04-14: 60 mg via INTRAMUSCULAR

## 2015-04-14 NOTE — Progress Notes (Signed)
CC: Mary Mcpherson is a [age] y.o. female is here for Migraine   Subjective: HPI:  Frontal headache which is pounding with photophobia and nausea that's been present since Thursday night. It was at its worst Friday afternoon and evening. It's now subsided to a moderate degree and present throughout the day. Other than above nothing seems to make it better or worse. No benefit from Excedrin or ibuprofen. She tells me feels like her history of chronic headaches however the duration of this was significantly longer. She denies worse headache of her life or waking up due to the headache. Denies fevers, chills, sore throat, motor or sensory disturbances or rash.  Last week she also noticed that her hair has been much more oily than she's used to. Denies itch or pain of the scalp. Symptoms improved with using dish soap on her scalp.   Review Of Systems Outlined In HPI  Past Medical History  Diagnosis Date  . Dysplasia of cervix 2007    Past Surgical History  Procedure Laterality Date  . Cesarean section  2006,2007    x 2  . Wisdom tooth extraction  2010  . Procedure for dysplasia  2007   Family History  Problem Relation Age of Onset  . Cancer Maternal Grandmother   . Heart attack      parent  . Diabetes      parents  . Hypertension      paternal grandmother  . Hyperlipidemia      paternal granmother    Social History   Social History  . Marital Status: Married    Spouse Name: N/A  . Number of Children: N/A  . Years of Education: N/A   Occupational History  . Not on file.   Social History Main Topics  . Smoking status: Never Smoker   . Smokeless tobacco: Not on file  . Alcohol Use: No  . Drug Use: No  . Sexual Activity:    Partners: Male   Other Topics Concern  . Not on file   Social History Narrative     Objective: BP 124/79 mmHg  Pulse 74  Wt 175 lb (79.379 kg)  General: Alert and Oriented, No Acute Distress HEENT: Pupils equal, round, reactive to light.  Conjunctivae clear.  Moist mucous membranes unremarkable Lungs: clear and comfortable work of breathing Cardiac: Regular rate and rhythm.  Extremities: No peripheral edema.  Strong peripheral pulses.  Mental Status: No depression, anxiety, nor agitation. Skin: Warm and dry. Neuro: CN II-XII grossly intact, full strength/rom of all four extremities,  gait normal, rapid alternating movements normal, heel-shin test normal, Rhomberg normal.  Assessment & Plan: Mary Mcpherson was seen today for migraine.  Diagnoses and all orders for this visit:  Other migraine without status migrainosus, intractable  Hair changes   Migraine: We'll try abortive therapy with tramadol, promethazine and Depo-Medrol.Signs and symptoms requring emergent/urgent reevaluation were discussed with the patient. Hair changes: Discussed trying Selsun Blue if symptoms persist, using dish soap on a daily basis will likely lead to scalp dryness and irritation.  Return if symptoms worsen or fail to improve.

## 2015-04-14 NOTE — Addendum Note (Signed)
Addended by: Thom ChimesHENRY, Edithe Dobbin M on: 04/14/2015 02:35 PM   Modules accepted: Orders

## 2015-04-23 ENCOUNTER — Encounter: Payer: Self-pay | Admitting: Family Medicine

## 2015-04-23 MED ORDER — ESCITALOPRAM OXALATE 5 MG PO TABS: ORAL_TABLET | ORAL | Status: DC

## 2015-05-23 ENCOUNTER — Ambulatory Visit: Payer: Managed Care, Other (non HMO) | Admitting: Family Medicine

## 2015-06-27 ENCOUNTER — Ambulatory Visit (INDEPENDENT_AMBULATORY_CARE_PROVIDER_SITE_OTHER): Payer: Managed Care, Other (non HMO) | Admitting: Family Medicine

## 2015-06-27 ENCOUNTER — Encounter: Payer: Self-pay | Admitting: Family Medicine

## 2015-06-27 VITALS — BP 137/91 | HR 126 | Temp 99.0°F | Wt 170.0 lb

## 2015-06-27 DIAGNOSIS — F411 Generalized anxiety disorder: Secondary | ICD-10-CM | POA: Diagnosis not present

## 2015-06-27 DIAGNOSIS — J189 Pneumonia, unspecified organism: Secondary | ICD-10-CM | POA: Diagnosis not present

## 2015-06-27 MED ORDER — LEVOFLOXACIN 500 MG PO TABS: 500.0000 mg | ORAL_TABLET | Freq: Every day | ORAL | Status: DC

## 2015-06-27 MED ORDER — ESCITALOPRAM OXALATE 5 MG PO TABS: ORAL_TABLET | ORAL | Status: DC

## 2015-06-27 MED ORDER — HYDROCODONE-HOMATROPINE 5-1.5 MG/5ML PO SYRP: 5.0000 mL | ORAL_SOLUTION | Freq: Four times a day (QID) | ORAL | Status: DC | PRN

## 2015-06-27 NOTE — Progress Notes (Signed)
CC: Mary Mcpherson is a 30 y.o. female is here for URI   Subjective: HPI:  2 weeks of worsening cough. No benefit from NyQuil, no other interventions as of yet. Is productive and occurring all hours of the day. Keeps her up at night. She feels fatigued and has even felt some fevers but no chills or night sweats. She denies any pain with breathing. She denies any wheezing but does have some mild shortness of breath with exertion. Denies any facial pressure or sore throat. No rash or confusion.  Her anxiety has worsened over the last few hours because she recently got the news that she's been fired from her job. She is having crying spells and nervousness about not knowing what to do for employment. Denies any depression.   Review Of Systems Outlined In HPI  Past Medical History  Diagnosis Date  . Dysplasia of cervix 2007    Past Surgical History  Procedure Laterality Date  . Cesarean section  2006,2007    x 2  . Wisdom tooth extraction  2010  . Procedure for dysplasia  2007   Family History  Problem Relation Age of Onset  . Cancer Maternal Grandmother   . Heart attack      parent  . Diabetes      parents  . Hypertension      paternal grandmother  . Hyperlipidemia      paternal granmother    Social History   Social History  . Marital Status: Married    Spouse Name: N/A  . Number of Children: N/A  . Years of Education: N/A   Occupational History  . Not on file.   Social History Main Topics  . Smoking status: Never Smoker   . Smokeless tobacco: Not on file  . Alcohol Use: No  . Drug Use: No  . Sexual Activity:    Partners: Male   Other Topics Concern  . Not on file   Social History Narrative     Objective: BP 137/91 mmHg  Pulse 126  Temp(Src) 99 F (37.2 C) (Oral)  Wt 170 lb (77.111 kg)  SpO2 98%  General: Alert and Oriented, No Acute Distress HEENT: Pupils equal, round, reactive to light. Conjunctivae clear.  External ears unremarkable, canals clear  with intact TMs with appropriate landmarks.  Middle ear appears open without effusion. Pink inferior turbinates.  Moist mucous membranes, pharynx without inflammation nor lesions.  Neck supple without palpable lymphadenopathy nor abnormal masses. Lungs: clearing comfortable work of breathing however there are diminished lung sounds in the right posterior inferior lung field.no wheezing or rhonchi Cardiac: Regular rate and rhythm. Normal S1/S2.  No murmurs, rubs, nor gallops.   Extremities: No peripheral edema.  Strong peripheral pulses.  Mental Status: No depression, anxiety, nor agitation. Skin: Warm and dry.  Assessment & Plan: Kourtnee was seen today for uri.  Diagnoses and all orders for this visit:  Generalized anxiety disorder  CAP (community acquired pneumonia)  Other orders -     levofloxacin (LEVAQUIN) 500 MG tablet; Take 1 tablet (500 mg total) by mouth daily. -     HYDROcodone-homatropine (HYCODAN) 5-1.5 MG/5ML syrup; Take 5 mLs by mouth every 6 (six) hours as needed for cough. -     escitalopram (LEXAPRO) 5 MG tablet; Half tablet daily, if headaches continue may take up to a full tablet daily.  Denies anxiety disorder worsened due to recent job loss. She stopped her Lexapro a few days ago, I did encourage her to  restart it today until she finds a future employer. High suspicion for community acquired pneumonia therefore start levofloxacin and as needed Hycodan for cough.Signs and symptoms requring emergent/urgent reevaluation were discussed with the patient.  Return if symptoms worsen or fail to improve.

## 2015-07-03 ENCOUNTER — Encounter: Payer: Self-pay | Admitting: Family Medicine

## 2015-07-03 DIAGNOSIS — R05 Cough: Secondary | ICD-10-CM

## 2015-07-03 DIAGNOSIS — R059 Cough, unspecified: Secondary | ICD-10-CM

## 2015-07-03 MED ORDER — PREDNISONE 20 MG PO TABS: ORAL_TABLET | ORAL | Status: AC

## 2015-07-03 MED ORDER — AZITHROMYCIN 250 MG PO TABS: ORAL_TABLET | ORAL | Status: AC

## 2015-07-30 ENCOUNTER — Other Ambulatory Visit (HOSPITAL_COMMUNITY)
Admission: RE | Admit: 2015-07-30 | Discharge: 2015-07-30 | Disposition: A | Discharge status: Home / Self Care | Payer: Managed Care, Other (non HMO) | Source: Ambulatory Visit | Attending: Family Medicine | Admitting: Family Medicine

## 2015-07-30 ENCOUNTER — Encounter: Payer: Self-pay | Admitting: Family Medicine

## 2015-07-30 ENCOUNTER — Ambulatory Visit (INDEPENDENT_AMBULATORY_CARE_PROVIDER_SITE_OTHER): Payer: Managed Care, Other (non HMO) | Admitting: Family Medicine

## 2015-07-30 VITALS — BP 116/73 | HR 70 | Wt 170.0 lb

## 2015-07-30 DIAGNOSIS — Z87410 Personal history of cervical dysplasia: Secondary | ICD-10-CM

## 2015-07-30 DIAGNOSIS — Z01419 Encounter for gynecological examination (general) (routine) without abnormal findings: Secondary | ICD-10-CM | POA: Diagnosis present

## 2015-07-30 DIAGNOSIS — Z1151 Encounter for screening for human papillomavirus (HPV): Secondary | ICD-10-CM | POA: Insufficient documentation

## 2015-07-30 DIAGNOSIS — Z0000 Encounter for general adult medical examination without abnormal findings: Secondary | ICD-10-CM

## 2015-07-30 LAB — COMPLETE METABOLIC PANEL WITH GFR
ALT: 11 U/L (ref 6–29)
AST: 13 U/L (ref 10–30)
Albumin: 4.4 g/dL (ref 3.6–5.1)
Alkaline Phosphatase: 46 U/L (ref 33–115)
BUN: 11 mg/dL (ref 7–25)
CALCIUM: 9.4 mg/dL (ref 8.6–10.2)
CHLORIDE: 107 mmol/L (ref 98–110)
CO2: 24 mmol/L (ref 20–31)
Creat: 0.74 mg/dL (ref 0.50–1.10)
GFR, Est Non African American: >89 mL/min (ref >=60)
Glucose, Bld: 79 mg/dL (ref 65–99)
POTASSIUM: 4 mmol/L (ref 3.5–5.3)
Sodium: 141 mmol/L (ref 135–146)
Total Bilirubin: 0.5 mg/dL (ref 0.2–1.2)
Total Protein: 7.1 g/dL (ref 6.1–8.1)

## 2015-07-30 LAB — CBC
HEMATOCRIT: 37.9 % (ref 36.0–46.0)
HEMOGLOBIN: 13 g/dL (ref 12.0–15.0)
MCH: 31.6 pg (ref 26.0–34.0)
MCHC: 34.3 g/dL (ref 30.0–36.0)
MCV: 92.2 fL (ref 78.0–100.0)
MPV: 10.1 fL (ref 8.6–12.4)
Platelets: 290 10*3/uL (ref 150–400)
RBC: 4.11 MIL/uL (ref 3.87–5.11)
RDW: 13.6 % (ref 11.5–15.5)
WBC: 3.8 10*3/uL — ABNORMAL LOW (ref 4.0–10.5)

## 2015-07-30 LAB — LIPID PANEL
CHOL/HDL RATIO: 3.5 ratio (ref <=5.0)
CHOLESTEROL: 173 mg/dL (ref 125–200)
HDL: 49 mg/dL (ref >=46)
LDL CALC: 114 mg/dL (ref <130)
TRIGLYCERIDES: 48 mg/dL (ref <150)
VLDL: 10 mg/dL (ref <30)

## 2015-07-30 NOTE — Progress Notes (Signed)
CC: Mary Mcpherson is a 30 y.o. female is here for Gynecologic Exam   Subjective: HPI:  Colonoscopy: No current indication Papsmear: History of abnormal pap, she elects to get a pap annually. Mammogram: No current indication  Influenza Vaccine: declined Pneumovax: No current indication Td/Tdap: UTD Zoster: (Start 31 yo)  Requesting complete physical exam with no complaints other than requesting a Pap smear.  Review of Systems - General ROS: negative for - chills, fever, night sweats, weight gain or weight loss Ophthalmic ROS: negative for - decreased vision Psychological ROS: negative for - anxiety or depression ENT ROS: negative for - hearing change, nasal congestion, tinnitus or allergies Hematological and Lymphatic ROS: negative for - bleeding problems, bruising or swollen lymph nodes Breast ROS: negative Respiratory ROS: no cough, shortness of breath, or wheezing Cardiovascular ROS: no chest pain or dyspnea on exertion Gastrointestinal ROS: no abdominal pain, change in bowel habits, or black or bloody stools Genito-Urinary ROS: negative for - genital discharge, genital ulcers, incontinence or abnormal bleeding from genitals Musculoskeletal ROS: negative for - joint pain or muscle pain Neurological ROS: negative for - headaches or memory loss Dermatological ROS: negative for lumps, mole changes, rash and skin lesion changes  Past Medical History  Diagnosis Date  . Dysplasia of cervix 2007    Past Surgical History  Procedure Laterality Date  . Cesarean section  2006,2007    x 2  . Wisdom tooth extraction  2010  . Procedure for dysplasia  2007   Family History  Problem Relation Age of Onset  . Cancer Maternal Grandmother   . Heart attack      parent  . Diabetes      parents  . Hypertension      paternal grandmother  . Hyperlipidemia      paternal granmother    Social History   Social History  . Marital Status: Married    Spouse Name: N/A  . Number of  Children: N/A  . Years of Education: N/A   Occupational History  . Not on file.   Social History Main Topics  . Smoking status: Never Smoker   . Smokeless tobacco: Not on file  . Alcohol Use: No  . Drug Use: No  . Sexual Activity:    Partners: Male   Other Topics Concern  . Not on file   Social History Narrative     Objective: BP 116/73 mmHg  Pulse 70  Wt 170 lb (77.111 kg)  General: No Acute Distress HEENT: Atraumatic, normocephalic, conjunctivae normal without scleral icterus.  No nasal discharge, hearing grossly intact, TMs with good landmarks bilaterally with no middle ear abnormalities, posterior pharynx clear without oral lesions. Neck: Supple, trachea midline, no cervical nor supraclavicular adenopathy. Pulmonary: Clear to auscultation bilaterally without wheezing, rhonchi, nor rales. Cardiac: Regular rate and rhythm.  No murmurs, rubs, nor gallops. No peripheral edema.  2+ peripheral pulses bilaterally. Abdomen: Bowel sounds normal.  No masses.  Non-tender without rebound.  Negative Murphy's sign. GU:  Labia majora & minora without lesions.  Distal urethra unremarkable.  Vaginal wall integrity preserved without mucosal lesions.  Cervix non-tender and without gross lesions nor discharge.   MSK: Grossly intact, no signs of weakness.  Full strength throughout upper and lower extremities.  Full ROM in upper and lower extremities.  No midline spinal tenderness. Neuro: Gait unremarkable, CN II-XII grossly intact.  C5-C6 Reflex 2/4 Bilaterally, L4 Reflex 2/4 Bilaterally.  Cerebellar function intact. Skin: No rashes. Psych: Alert and oriented to person/place/time.  Thought process normal. No anxiety/depression.  Assessment & Plan: [name] was seen today for gynecologic exam.  Diagnoses and all orders for this visit:  Annual physical exam -     Lipid panel -     COMPLETE METABOLIC PANEL WITH GFR -     CBC -     Cytology - PAP  History of cervical dysplasia -      Cytology - PAP   Healthy lifestyle interventions including but not limited to regular exercise, a healthy low fat diet, moderation of salt intake, the dangers of tobacco/alcohol/recreational drug use, nutrition supplementation, and accident avoidance were discussed with the patient and a handout was provided for future reference.  Return if symptoms worsen or fail to improve.

## 2015-07-31 LAB — CYTOLOGY - PAP

## 2016-04-08 ENCOUNTER — Ambulatory Visit (INDEPENDENT_AMBULATORY_CARE_PROVIDER_SITE_OTHER): Payer: BLUE CROSS/BLUE SHIELD | Admitting: Family Medicine

## 2016-04-08 ENCOUNTER — Encounter: Payer: Self-pay | Admitting: Family Medicine

## 2016-04-08 VITALS — BP 117/74 | HR 102 | Temp 98.7°F | Ht 67.0 in | Wt 177.0 lb

## 2016-04-08 DIAGNOSIS — J029 Acute pharyngitis, unspecified: Secondary | ICD-10-CM | POA: Diagnosis not present

## 2016-04-08 LAB — POCT RAPID STREP A (OFFICE): RAPID STREP A SCREEN: NEGATIVE

## 2016-04-08 NOTE — Progress Notes (Signed)
   Subjective:    Patient ID: Tommas OlpJessica Nauta, female    DOB: 11/12/1985, 30 y.o.   MRN: [id number]  HPI 30 year old female comes in today complaining of feeling sick for about 2 days. She complains of bilateral ear pain though worse on the left. Her throat initially felt scratchy but actually feels more like it's on fire today. She is getting sharp pains with swallowing in both years. She's been taking over-the-counter NyQuil, Tylenol cold and flu.    Review of Systems     Objective:   Physical Exam  Constitutional: She is oriented to person, place, and time. She appears well-developed and well-nourished.  HENT:  Head: Normocephalic and atraumatic.  Right Ear: External ear normal.  Left Ear: External ear normal.  Nose: Nose normal.  Mouth/Throat: Oropharynx is clear and moist.  TMs and canals are clear.   Eyes: Conjunctivae and EOM are normal. Pupils are equal, round, and reactive to light.  Neck: Neck supple. No thyromegaly present.  Cardiovascular: Normal rate, regular rhythm and normal heart sounds.   Pulmonary/Chest: Effort normal and breath sounds normal. She has no wheezes.  Lymphadenopathy:    She has no cervical adenopathy.  Neurological: She is alert and oriented to person, place, and time.  Skin: Skin is warm and dry.  Psychiatric: She has a normal mood and affect.       Assessment & Plan:  Viral Pharyngitis - Strep test negative. Gave reassurance. Recommend salt water gargles and lozenges and Chloraseptic spray. If not feeling better by Monday or getting worse and please let us know. Okay to continue over-the-counter cough and cold medication.

## 2016-04-08 NOTE — Patient Instructions (Addendum)

## 2016-05-24 ENCOUNTER — Ambulatory Visit (INDEPENDENT_AMBULATORY_CARE_PROVIDER_SITE_OTHER): Payer: BLUE CROSS/BLUE SHIELD | Admitting: Osteopathic Medicine

## 2016-05-24 ENCOUNTER — Encounter: Payer: Self-pay | Admitting: Osteopathic Medicine

## 2016-05-24 VITALS — BP 117/73 | HR 102 | Wt 176.0 lb

## 2016-05-24 DIAGNOSIS — F411 Generalized anxiety disorder: Secondary | ICD-10-CM | POA: Diagnosis not present

## 2016-05-24 DIAGNOSIS — Z87410 Personal history of cervical dysplasia: Secondary | ICD-10-CM | POA: Diagnosis not present

## 2016-05-24 DIAGNOSIS — G43819 Other migraine, intractable, without status migrainosus: Secondary | ICD-10-CM

## 2016-05-24 DIAGNOSIS — S39012A Strain of muscle, fascia and tendon of lower back, initial encounter: Secondary | ICD-10-CM

## 2016-05-24 DIAGNOSIS — Z975 Presence of (intrauterine) contraceptive device: Secondary | ICD-10-CM

## 2016-05-24 DIAGNOSIS — E663 Overweight: Secondary | ICD-10-CM

## 2016-05-24 NOTE — Patient Instructions (Signed)
Weight loss: important things to remember  It is hard work! You will have setbacks, but don't get discouraged. The goal is not short-term success, it is long-term health.   Looking at the numbers is important to track your progress and set goals, but how you are feeling and your overall health are the most important things! BMI and pounds and calories and miles logged aren't everything - they are tools to help us reach your goals.  You can do this!!!   Things to remember for exercise for weight loss:   Please note - I am not a certified personal trainer. I can present you with ideas and general workout goals, but an exercise program is largely up to you. Find something you can stick with, and something you enjoy!   As you progress in your exercise regimen think about gradually increasing the following, week by week:   intensity (how strenuous is your workout)  frequency (how often you are exercising)  duration (how many minutes at a time you are exercising)  Walking for 20 minutes a day is certainly better than nothing, but more strenuous exercise will develop better cardiovascular fitness.   interval training (high-intensity alternating with low-intensity, think walk/jog rather than just walk)  muscle strengthening exercises (weight lifting, calisthenics, yoga) - this also helps prevent osteoporosis!   Things to remember for diet changes for weight loss:   Please note - I am not a certified dietician. I can present you with ideas and general diet goals, but a meal plan is largely up to you. I am happy to refer you to a dietician who can give you a detailed meal plan.  Apps/logs are crucial to track how you're eating! It's not realistic to be logging everything you eat forever, but when you're starting a healthy eating lifestyle it's very helpful, and checking in with logs now and then helps you stick to your program!   Calorie restriction with the goal weight loss of no more than one  to one and a half pounds per week.   Increase lean protein such as chicken, fish, turkey.   Decrease fatty foods such as dairy, butter.   Decrease sugary foods. Avoid sugary drinks such as soda or juice.  Increase fiber found in fruit and vegetables.   

## 2016-05-24 NOTE — Progress Notes (Signed)
HPI: Mary Mcpherson is a 31 y.o. female  who presents to [facility] [place] today, 05/24/16,  for chief complaint of:  Chief Complaint  Patient presents with  . Establish Care    SWITCH FROM HOMMEL   Back pain: new problem . Location: lower left . Quality: soreness . Timing: started this morning, no triggering injury, maybe worse when getting out of car . Context: hx twisting injury few years ago that flares up like this now and then . Modifying factors: ibuprofen typically helps . Assoc signs/symptoms: no numbness/shooting pain   Other medical history reviewed   Generalized anxiety disorder: weaned herself lexapro and hydroxyzine. Has started taking magnesium which seems to have helped - few months, 500 mg capsule per day   History of cervical dysplasia: prefers paps annually  Migraine: no problems with the headaches, previously stress induced and has been doing well since no longer working as stressful a job.   IUD (intrauterine device) in place: considering removal to try for pregnancy but still undecided at this time       Past medical, surgical, social and family history reviewed: Patient Active Problem List   Diagnosis Date Noted  . Migraine 04/14/2015  . Shingles 03/01/2014  . Generalized anxiety disorder 03/01/2014  . History of positive PPD 07/02/2013  . History of cervical dysplasia 06/22/2013  . IUD (intrauterine device) in place 06/22/2013   Past Surgical History:  Procedure Laterality Date  . CESAREAN SECTION  2006,2007   x 2  . procedure for dysplasia  2007  . WISDOM TOOTH EXTRACTION  2010   Social History  Substance Use Topics  . Smoking status: Never Smoker  . Smokeless tobacco: Not on file  . Alcohol use No   Family History  Problem Relation Age of Onset  . Cancer Maternal Grandmother   . Heart attack      parent  . Diabetes      parents  . Hypertension      paternal grandmother  . Hyperlipidemia       paternal granmother     Current medication list and allergy/intolerance information reviewed:   Current Outpatient Prescriptions on File Prior to Visit  Medication Sig Dispense Refill  . levonorgestrel (MIRENA) 20 MCG/24HR IUD 1 each by Intrauterine route once.    . Prenatal Multivit-Min-Fe-FA (PRENATAL VITAMINS PO) Take by mouth.     No current facility-administered medications on file prior to visit.    No Known Allergies    Review of Systems:  Constitutional: No recent illness  HEENT: No  headache, no vision change  Cardiac: No  chest pain, No  pressure, No palpitations  Respiratory:  No  shortness of breath. No  Cough  Gastrointestinal: No  abdominal pain, no change on bowel habits  Musculoskeletal: +new myalgia/arthralgia  Skin: No  Rash  Hem/Onc: No  easy bruising/bleeding, No  abnormal lumps/bumps  Neurologic: No  weakness, No  Dizziness  Psychiatric: No  concerns with depression, No  concerns with anxiety  Exam:  BP 117/73   Pulse (!) 102   Wt 176 lb (79.8 kg)   BMI 27.57 kg/m   Constitutional: VS see above. General Appearance: alert, well-developed, well-nourished, NAD  Eyes: Normal lids and conjunctive, non-icteric sclera  Ears, Nose, Mouth, Throat: MMM, Normal external inspection ears/nares/mouth/lips/gums.  Neck: No masses, trachea midline.   Respiratory: Normal respiratory effort. no wheeze, no rhonchi, no rales  Cardiovascular: S1/S2 normal, no murmur, no rub/gallop auscultated. RRR.   Musculoskeletal:  Gait normal. Symmetric and independent movement of all extremities. (+)pain on R lumbar area w/ forward flexion, restricted back extension, nontender, neg straight leg raise bilaterally   Neurological: Normal balance/coordination. No tremor.  Skin: warm, dry, intact.   Psychiatric: Normal judgment/insight. Normal mood and affect. Oriented x3.     ASSESSMENT/PLAN:   Lumbar strain, initial encounter - conservative treatment, home exercises  given, PT referral if no better  Generalized anxiety disorder - plan to check Mag with labs, pt doing well on no Rx  History of cervical dysplasia - plan for pap  Other migraine without status migrainosus, intractable - resolved with decreased stress, will monitor prn  IUD (intrauterine device) in place - advised removal when desired/5 years after insertion  Overweight - nonobese - discussion of lifestyle interventions for healthy weight loss     Visit summary with medication list and pertinent instructions was printed for patient to review. All questions at time of visit were answered - patient instructed to contact office with any additional concerns. ER/RTC precautions were reviewed with the patient. Follow-up plan: [facility] DUE, sooner if needed.  Note: Total time spent 30 minutes, greater than 50% of the visit was spent face-to-face counseling and coordinating care for the following: The primary encounter diagnosis was Lumbar strain, initial encounter. Diagnoses of Generalized anxiety disorder, History of cervical dysplasia, Other migraine without status migrainosus, intractable, IUD (intrauterine device) in place, and Overweight were also pertinent to this visit..Marland Kitchen

## 2016-06-08 ENCOUNTER — Encounter: Payer: Self-pay | Admitting: Osteopathic Medicine

## 2016-06-09 ENCOUNTER — Encounter: Payer: Self-pay | Admitting: Osteopathic Medicine

## 2016-06-09 ENCOUNTER — Ambulatory Visit (INDEPENDENT_AMBULATORY_CARE_PROVIDER_SITE_OTHER): Payer: BLUE CROSS/BLUE SHIELD | Admitting: Osteopathic Medicine

## 2016-06-09 VITALS — BP 114/70 | HR 84 | Temp 98.2°F | Ht 67.0 in | Wt 175.0 lb

## 2016-06-09 DIAGNOSIS — J208 Acute bronchitis due to other specified organisms: Secondary | ICD-10-CM

## 2016-06-09 DIAGNOSIS — B9789 Other viral agents as the cause of diseases classified elsewhere: Secondary | ICD-10-CM | POA: Diagnosis not present

## 2016-06-09 DIAGNOSIS — J069 Acute upper respiratory infection, unspecified: Secondary | ICD-10-CM | POA: Diagnosis not present

## 2016-06-09 MED ORDER — AZITHROMYCIN 250 MG PO TABS: ORAL_TABLET | ORAL | 0 refills | Status: DC

## 2016-06-09 MED ORDER — BENZONATATE 200 MG PO CAPS: 200.0000 mg | ORAL_CAPSULE | Freq: Three times a day (TID) | ORAL | 0 refills | Status: DC | PRN

## 2016-06-09 NOTE — Progress Notes (Signed)
HPI: Mary Mcpherson is a 31 y.o. female who presents to [facility] 06/09/16 for chief complaint of:  Chief Complaint  Patient presents with  . Generalized Body Aches    Acute Illness: . Context: mother in law recently having flu symptoms (no official diagnosis) . Location:  . Quality: started with some congestion and sinus issues, getting worse over the course of the past week. (+)coughing quite a bit.  . Assoc signs/symptoms: see ROS - no fever (has been measuring temperature) . Duration: 7 days . Modifying factors: has tried the following OTC/Rx medications: has tried generic sudafed which helps some but not much    Past medical, social and family history reviewed. Current medications and allergies reviewed.     Review of Systems:  Constitutional: No  fever/chills  HEENT: Yes  headache, No  sore throat, No  swollen glands  Cardiovascular: No chest pain  Respiratory:Yes  cough, No  shortness of breath  Gastrointestinal: No  nausea, No  vomiting,  No  diarrhea  Musculoskeletal:   No  myalgia/arthralgia  Skin/Integument:  No  rash   Exam:  BP 114/70   Pulse 84   Temp 98.2 F (36.8 C) (Oral)   Ht 5\' 7"  (1.702 m)   Wt 175 lb (79.4 kg)   BMI 27.41 kg/m   Constitutional: VSS, see above. General Appearance: alert, well-developed, well-nourished, NAD  Eyes: Normal lids and conjunctive, non-icteric sclera, PERRLA  Ears, Nose, Mouth, Throat: Normal external inspection ears/nares/mouth/lips/gums, normal TM, MMM; posterior pharynx without erythema, without exudate, nasal mucosa normal  Neck: No masses, trachea midline. normal lymph nodes  Respiratory: Normal respiratory effort. No  wheeze/rhonchi/rales  Cardiovascular: S1/S2 normal, no murmur/rub/gallop auscultated. RRR.      ASSESSMENT/PLAN: maximize OTC medication treatment, fill abx if no better 1-3 days or if worse. RTC if severe cough/fever/SOB.   Viral URI with  cough  Viral bronchitis     Patient Instructions  Note: the following list assumes no pregnancy, normal liver & kidney function and no other drug interactions. Dr. Lyn Hollingshead has highlighted medications which are safe for you to use, but these may not be appropriate for everyone. Always ask a pharmacist or qualified medical provider if there are any questions!    Aches/Pains, Fever Acetaminophen (Tylenol) 500 mg tablets - take max 2 tablets (1000 mg) every 6 hours (4 times per day)  Ibuprofen (Motrin) 200 mg tablets - take max 4 tablets (800 mg) every 6 hours  Sinus Congestion Prescription Atrovent Cromolyn Nasal Spray (NasalCrom) 1 spray each nostril 3-4 times per day, max 6 imes per day Nasal Saline if desired Oxymetolazone (Afrin, others) sparing use due to rebound congestion Phenylephrine (Sudafed) 10 mg tablets every 4 hours (or the 12-hour formulation) Diphenhydramine (Benadryl) 25 mg tablets - take max 2 tablets every 4 hours  Cough & Sore Throat Prescription cough pills or syrups Dextromethorphan (Robitussin, others) - cough suppressant Guaifenesin (Robitussin, Mucinex, others) - expectorant (helps cough up mucus) (Dextromethorphan and Guaifenesin also come in a combination tablet) Lozenges w/ Benzocaine + Menthol (Cepacol) Honey - as much as you want! Teas which "coat the throat" - look for ingredients Elm Bark, Licorice Root, Marshmallow Root  Other Zinc Lozenges within 24 hours of symptoms onset - mixed evidence this shortens the duration of the common cold Don't waste your money on Vitamin C or Echinacea       Visit summary was printed for the patient with medications and pertinent instructions for patient to  review. ER/RTC precautions reviewed. All questions answered. Return if symptoms worsen or fail to improve.

## 2016-06-09 NOTE — Patient Instructions (Signed)
Note: the following list assumes no pregnancy, normal liver & kidney function and no other drug interactions. Dr. Kwinton Maahs has highlighted medications which are safe for you to use, but these may not be appropriate for everyone. Always ask a pharmacist or qualified medical provider if there are any questions!    Aches/Pains, Fever Acetaminophen (Tylenol) 500 mg tablets - take max 2 tablets (1000 mg) every 6 hours (4 times per day)  Ibuprofen (Motrin) 200 mg tablets - take max 4 tablets (800 mg) every 6 hours  Sinus Congestion Prescription Atrovent Cromolyn Nasal Spray (NasalCrom) 1 spray each nostril 3-4 times per day, max 6 imes per day Nasal Saline if desired Oxymetolazone (Afrin, others) sparing use due to rebound congestion Phenylephrine (Sudafed) 10 mg tablets every 4 hours (or the 12-hour formulation) Diphenhydramine (Benadryl) 25 mg tablets - take max 2 tablets every 4 hours  Cough & Sore Throat Prescription cough pills or syrups Dextromethorphan (Robitussin, others) - cough suppressant Guaifenesin (Robitussin, Mucinex, others) - expectorant (helps cough up mucus) (Dextromethorphan and Guaifenesin also come in a combination tablet) Lozenges w/ Benzocaine + Menthol (Cepacol) Honey - as much as you want! Teas which "coat the throat" - look for ingredients Elm Bark, Licorice Root, Marshmallow Root  Other Zinc Lozenges within 24 hours of symptoms onset - mixed evidence this shortens the duration of the common cold Don't waste your money on Vitamin C or Echinacea    

## 2016-06-28 ENCOUNTER — Encounter: Payer: Self-pay | Admitting: Osteopathic Medicine

## 2016-06-28 ENCOUNTER — Other Ambulatory Visit (HOSPITAL_COMMUNITY)
Admission: RE | Admit: 2016-06-28 | Discharge: 2016-06-28 | Disposition: A | Discharge status: Home / Self Care | Payer: BLUE CROSS/BLUE SHIELD | Source: Ambulatory Visit | Attending: Osteopathic Medicine | Admitting: Osteopathic Medicine

## 2016-06-28 ENCOUNTER — Ambulatory Visit (INDEPENDENT_AMBULATORY_CARE_PROVIDER_SITE_OTHER): Payer: BLUE CROSS/BLUE SHIELD | Admitting: Osteopathic Medicine

## 2016-06-28 VITALS — BP 103/63 | HR 75 | Ht 67.0 in | Wt 174.0 lb

## 2016-06-28 DIAGNOSIS — Z01419 Encounter for gynecological examination (general) (routine) without abnormal findings: Secondary | ICD-10-CM | POA: Insufficient documentation

## 2016-06-28 DIAGNOSIS — Z1151 Encounter for screening for human papillomavirus (HPV): Secondary | ICD-10-CM | POA: Diagnosis present

## 2016-06-28 DIAGNOSIS — Z975 Presence of (intrauterine) contraceptive device: Secondary | ICD-10-CM | POA: Diagnosis not present

## 2016-06-28 DIAGNOSIS — Z0000 Encounter for general adult medical examination without abnormal findings: Secondary | ICD-10-CM

## 2016-06-28 LAB — COMPLETE METABOLIC PANEL WITH GFR
ALT: 9 U/L (ref 6–29)
AST: 13 U/L (ref 10–30)
Albumin: 4.1 g/dL (ref 3.6–5.1)
Alkaline Phosphatase: 47 U/L (ref 33–115)
BUN: 10 mg/dL (ref 7–25)
CHLORIDE: 107 mmol/L (ref 98–110)
CO2: 27 mmol/L (ref 20–31)
Calcium: 9.1 mg/dL (ref 8.6–10.2)
Creat: 0.76 mg/dL (ref 0.50–1.10)
GFR, Est African American: >89 mL/min (ref >=60)
GFR, Est Non African American: >89 mL/min (ref >=60)
GLUCOSE: 81 mg/dL (ref 65–99)
POTASSIUM: 4.1 mmol/L (ref 3.5–5.3)
Sodium: 140 mmol/L (ref 135–146)
Total Bilirubin: 0.6 mg/dL (ref 0.2–1.2)
Total Protein: 7 g/dL (ref 6.1–8.1)

## 2016-06-28 LAB — CBC WITH DIFFERENTIAL/PLATELET
Basophils Absolute: 0 cells/uL (ref 0–200)
Basophils Relative: 0 %
Eosinophils Absolute: 44 cells/uL (ref 15–500)
Eosinophils Relative: 1 %
HCT: 37.8 % (ref 35.0–45.0)
Hemoglobin: 12.4 g/dL (ref 11.7–15.5)
LYMPHS PCT: 29 %
Lymphs Abs: 1276 cells/uL (ref 850–3900)
MCH: 30.8 pg (ref 27.0–33.0)
MCHC: 32.8 g/dL (ref 32.0–36.0)
MCV: 94 fL (ref 80.0–100.0)
MONOS PCT: 10 %
MPV: 10.3 fL (ref 7.5–12.5)
Monocytes Absolute: 440 cells/uL (ref 200–950)
NEUTROS PCT: 60 %
Neutro Abs: 2640 cells/uL (ref 1500–7800)
Platelets: 282 10*3/uL (ref 140–400)
RBC: 4.02 MIL/uL (ref 3.80–5.10)
RDW: 13.5 % (ref 11.0–15.0)
WBC: 4.4 10*3/uL (ref 3.8–10.8)

## 2016-06-28 LAB — TSH: TSH: 1.54 m[IU]/L

## 2016-06-28 LAB — LIPID PANEL
CHOL/HDL RATIO: 3.2 ratio (ref <5.0)
CHOLESTEROL: 162 mg/dL (ref <200)
HDL: 50 mg/dL — ABNORMAL LOW (ref >50)
LDL Cholesterol: 101 mg/dL — ABNORMAL HIGH (ref <100)
Triglycerides: 55 mg/dL (ref <150)
VLDL: 11 mg/dL (ref <30)

## 2016-06-28 NOTE — Progress Notes (Signed)
HPI: Mary Mcpherson is a 31 y.o. female  who [facility] [place] today, 06/28/16,  for chief complaint of:  Chief Complaint  Patient presents with  . Annual Exam  . Gynecologic Exam    Here today for annual physical exam. No complaints. See below for review of preventive care  Past medical, surgical, social and family history reviewed: Patient Active Problem List   Diagnosis Date Noted  . Migraine 04/14/2015  . Shingles 03/01/2014  . Generalized anxiety disorder 03/01/2014  . History of positive PPD 07/02/2013  . History of cervical dysplasia 06/22/2013  . IUD (intrauterine device) in place 06/22/2013   Past Surgical History:  Procedure Laterality Date  . CESAREAN SECTION  2006,2007   x 2  . procedure for dysplasia  2007  . WISDOM TOOTH EXTRACTION  2010   Social History  Substance Use Topics  . Smoking status: Never Smoker  . Smokeless tobacco: Never Used  . Alcohol use No   Family History  Problem Relation Age of Onset  . Cancer Maternal Grandmother   . Heart attack      parent  . Diabetes      parents  . Hypertension      paternal grandmother  . Hyperlipidemia      paternal granmother     Current medication list and allergy/intolerance information reviewed:   Current Outpatient Prescriptions  Medication Sig Dispense Refill  . azithromycin (ZITHROMAX) 250 MG tablet 2 tabs po x1 on Day 1, then 1 tab po daily on Days 2 - 5 6 tablet 0  . benzonatate (TESSALON) 200 MG capsule Take 1 capsule (200 mg total) by mouth 3 (three) times daily as needed for cough. 30 capsule 0  . levonorgestrel (MIRENA) 20 MCG/24HR IUD 1 each by Intrauterine route once.    . Prenatal Multivit-Min-Fe-FA (PRENATAL VITAMINS PO) Take by mouth.     No current facility-administered medications for this visit.    No Known Allergies    Review of Systems:  Constitutional:  No  fever, no chills, No recent illness, No unintentional weight changes. No  significant fatigue.   HEENT: No  headache, no vision change, no hearing change, No sore throat, No  sinus pressure  Cardiac: No  chest pain, No  pressure, No palpitations, No  Orthopnea  Respiratory:  No  shortness of breath. No  Cough  Gastrointestinal: No  abdominal pain, No  nausea, No  vomiting,  No  blood in stool, No  diarrhea, No  constipation   Musculoskeletal: No new myalgia/arthralgia  Genitourinary: No  incontinence, No  abnormal genital bleeding, No abnormal genital discharge  Skin: No  Rash, No other wounds/concerning lesions  Endocrine: No cold intolerance,  No heat intolerance.  Neurologic: No  weakness, No  dizziness,   Psychiatric: No  concerns with depression, No  concerns with anxiety, No sleep problems, No mood problems  Exam:  BP 103/63   Pulse 75   Ht 5\' 7"  (1.702 m)   Wt 174 lb (78.9 kg)   BMI 27.25 kg/m   Constitutional: VS see above. General Appearance: alert, well-developed, well-nourished, NAD  Eyes: Normal lids and conjunctive, non-icteric sclera  Ears, Nose, Mouth, Throat: MMM, Normal external inspection ears/nares/mouth/lips/gums. TM normal bilaterally. Pharynx/tonsils no erythema, no exudate. Nasal mucosa normal.   Neck: No masses, trachea midline. No thyroid enlargement. No tenderness/mass appreciated. No lymphadenopathy  Respiratory: Normal respiratory effort. no wheeze, no rhonchi, no rales  Cardiovascular: S1/S2 normal, no murmur,  no rub/gallop auscultated. RRR. No lower extremity edema. Pedal pulse II/IV bilaterally DP and PT. No carotid bruit or JVD. No abdominal aortic bruit.  Gastrointestinal: Nontender, no masses. No hepatomegaly, no splenomegaly. No hernia appreciated. Bowel sounds normal. Rectal exam deferred.   Musculoskeletal: Gait normal. No clubbing/cyanosis of digits.   Neurological: Normal balance/coordination. No tremor. No cranial nerve deficit on limited exam. Motor and sensation intact and symmetric. Cerebellar  reflexes intact.   Skin: warm, dry, intact. No rash/ulcer. No concerning nevi or subq nodules on limited exam.    Psychiatric: Normal judgment/insight. Normal mood and affect. Oriented x3.  GYN: No lesions/ulcers to external genitalia, normal urethra, normal vaginal mucosa, physiologic discharge, cervix normal without lesions, uterus not enlarged or tender, adnexa no masses and nontender. IUD strings visible  BREAST: No rashes/skin changes, normal fibrous breast tissue, no masses or tenderness, normal nipple without discharge, normal axilla    ASSESSMENT/PLAN:   Annual physical exam - Plan: Cytology - PAP, CBC with Differential/Platelet, COMPLETE METABOLIC PANEL WITH GFR, Lipid panel, TSH, VITAMIN D 25 Hydroxy (Vit-D Deficiency, Fractures)  IUD (intrauterine device) in place   FEMALE PREVENTIVE CARE Updated 06/28/16   ANNUAL SCREENING/COUNSELING Diet/Exercise - HEALTHY HABITS DISCUSSED TO DECREASE CV RISK History  Smoking Status  . Never Smoker  Smokeless Tobacco  . Never Used   History  Alcohol Use  . 0.6 oz/week  . 1 Glasses of wine per week   Depression screen [facility] 2/9 06/28/2016  Decreased Interest 0  Down, Depressed, Hopeless 0  PHQ - 2 Score 0   Domestic violence concerns - no HTN SCREENING - SEE VITALS  SEXUAL HEALTH  Sexually active in the past year - Yes with female.  Need/want STI testing today? - no  Concerns about libido or pain with sex? - no  Plans for pregnancy? - none currently, leaving IUD in place    INFECTIOUS DISEASE SCREENING  HIV - does not need  GC/CT - does not need  HepC - DOB [birth year] - does not need  TB - does not need  DISEASE SCREENING  Lipid - needs  DM2 - needs  Osteoporosis - women age 71+ - does not need  CANCER SCREENING  Cervical - does not need  Breast - does not need  Lung - does not need  Colon - does not need  ADULT VACCINATION  Influenza - annual vaccine recommended  Td - booster every 10 years    Zoster - option at 50, yes at 60+   PCV13 - was not indicated  PPSV23 - was not indicated Immunization History  Administered Date(s) Administered  . Tdap 07/13/2013     Visit summary with medication list and pertinent instructions was printed for patient to review. All questions at time of visit were answered - patient instructed to contact office with any additional concerns. ER/RTC precautions were reviewed with the patient. Follow-up plan: Return in about 1 year (around 06/28/2017) for [facility], sooner if needed.

## 2016-06-29 LAB — VITAMIN D 25 HYDROXY (VIT D DEFICIENCY, FRACTURES): VIT D 25 HYDROXY: 22 ng/mL — AB (ref 30–100)

## 2016-07-01 LAB — CYTOLOGY - PAP
Diagnosis: NEGATIVE
HPV: NOT DETECTED

## 2017-02-23 DIAGNOSIS — H1013 Acute atopic conjunctivitis, bilateral: Secondary | ICD-10-CM | POA: Diagnosis not present

## 2017-05-12 ENCOUNTER — Encounter: Payer: Self-pay | Admitting: Osteopathic Medicine

## 2017-05-12 ENCOUNTER — Ambulatory Visit (INDEPENDENT_AMBULATORY_CARE_PROVIDER_SITE_OTHER): Payer: BLUE CROSS/BLUE SHIELD | Admitting: Osteopathic Medicine

## 2017-05-12 VITALS — BP 112/73 | HR 82 | Temp 98.6°F | Wt 181.5 lb

## 2017-05-12 DIAGNOSIS — Z0000 Encounter for general adult medical examination without abnormal findings: Secondary | ICD-10-CM

## 2017-05-12 DIAGNOSIS — Z30432 Encounter for removal of intrauterine contraceptive device: Secondary | ICD-10-CM

## 2017-05-12 NOTE — Progress Notes (Signed)
HPI: Mary Mcpherson is a 32 y.o. female who  has a past medical history of Dysplasia of cervix (2007).  she presents to [facility] today, 05/12/17,  for chief complaint of:  Chief Complaint  Patient presents with  . Contraception issue - removal of mirena     Would like Mirena removed today, 5 years in. She and her husband are going to start trying for a baby.      Past medical, surgical, social and family history reviewed:  Patient Active Problem List   Diagnosis Date Noted  . Migraine 04/14/2015  . Shingles 03/01/2014  . Generalized anxiety disorder 03/01/2014  . History of positive PPD 07/02/2013  . History of cervical dysplasia 06/22/2013      Social History   Tobacco Use  . Smoking status: Never Smoker  . Smokeless tobacco: Never Used  Substance Use Topics  . Alcohol use: Yes    Alcohol/week: 0.6 oz    Types: 1 Glasses of wine per week    Family History  Problem Relation Age of Onset  . Cancer Maternal Grandmother   . Heart attack Unknown        parent  . Diabetes Unknown        parents  . Hypertension Unknown        paternal grandmother  . Hyperlipidemia Unknown        paternal granmother     Current medication list and allergy/intolerance information reviewed:    Current Outpatient Medications  Medication Sig Dispense Refill  . levonorgestrel (MIRENA) 20 MCG/24HR IUD 1 each by Intrauterine route once.    . Prenatal Multivit-Min-Fe-FA (PRENATAL VITAMINS PO) Take by mouth.     No current facility-administered medications for this visit.     No Known Allergies    Review of Systems:  Constitutional:  No  fever, no chills, No recent illness,   Gastrointestinal: No  abdominal pain, No  nausea,   Genitourinary: No  incontinence, No  abnormal genital bleeding, No abnormal genital discharge  Skin: No  Rash   Exam:  BP 112/73   Pulse 82   Temp 98.6 F (37 C) (Oral)   Wt 181 lb 8 oz (82.3 kg)   BMI 28.43  kg/m   Constitutional: VS see above. General Appearance: alert, well-developed, well-nourished, NAD    Psychiatric: Normal judgment/insight. Normal mood and affect. Oriented x3.  GYN: No lesions/ulcers to external genitalia, normal urethra, normal vaginal mucosa, physiologic discharge, cervix normal without lesions, IUD strings visible, grasped with ring forceps and Mirena IUD removed easily       ASSESSMENT/PLAN:   Encounter for IUD removal - tolerated procedure well, does not desire contraception at this time, trying for pregnancy   Annual physical exam - labs ordered for future viist - no preventive care was addressed or billed today  - Plan: CBC, COMPLETE METABOLIC PANEL WITH GFR, Lipid panel, TSH, VITAMIN D 25 Hydroxy (Vit-D Deficiency, Fractures)    Patient Instructions  Center for [facility] - down the hall here in [facility].   [phone]      Visit summary with medication list and pertinent instructions was printed for patient to review. All questions at time of visit were answered - patient instructed to contact office with any additional concerns. ER/RTC precautions were reviewed with the patient.   Follow-up plan: Return for annual physical when due - fastin glabs at your convenience .  Note: Total time spent 15 minutes, greater than  50% of the visit was spent face-to-face counseling and coordinating care for the following: The primary encounter diagnosis was Encounter for IUD removal. A diagnosis of Annual physical exam was also pertinent to this visit.Marland Kitchen.  Please note: voice recognition software was used to produce this document, and typos may escape review. Please contact Dr. Lyn HollingsheadAlexander for any needed clarifications.

## 2017-05-12 NOTE — Patient Instructions (Signed)
[facility] the [facility].   [phone]

## 2017-06-10 ENCOUNTER — Encounter: Payer: Self-pay | Admitting: Family Medicine

## 2017-06-10 ENCOUNTER — Ambulatory Visit (INDEPENDENT_AMBULATORY_CARE_PROVIDER_SITE_OTHER): Payer: 59 | Admitting: Family Medicine

## 2017-06-10 VITALS — BP 104/73 | HR 95 | Ht 67.0 in | Wt 177.0 lb

## 2017-06-10 DIAGNOSIS — N3000 Acute cystitis without hematuria: Secondary | ICD-10-CM | POA: Diagnosis not present

## 2017-06-10 DIAGNOSIS — M545 Low back pain, unspecified: Secondary | ICD-10-CM

## 2017-06-10 DIAGNOSIS — M5432 Sciatica, left side: Secondary | ICD-10-CM | POA: Diagnosis not present

## 2017-06-10 DIAGNOSIS — R350 Frequency of micturition: Secondary | ICD-10-CM | POA: Diagnosis not present

## 2017-06-10 LAB — POCT URINALYSIS DIPSTICK
BILIRUBIN UA: NEGATIVE
GLUCOSE UA: NEGATIVE
KETONES UA: NEGATIVE
Nitrite, UA: NEGATIVE
PH UA: 7 (ref 5.0–8.0)
Protein, UA: NEGATIVE
Spec Grav, UA: 1.025 (ref 1.010–1.025)
UROBILINOGEN UA: 0.2 U/dL

## 2017-06-10 MED ORDER — PREDNISONE 20 MG PO TABS: 40.0000 mg | ORAL_TABLET | Freq: Every day | ORAL | 0 refills | Status: DC

## 2017-06-10 MED ORDER — SULFAMETHOXAZOLE-TRIMETHOPRIM 800-160 MG PO TABS: 1.0000 | ORAL_TABLET | Freq: Two times a day (BID) | ORAL | 0 refills | Status: DC

## 2017-06-10 MED ORDER — CYCLOBENZAPRINE HCL 10 MG PO TABS: 10.0000 mg | ORAL_TABLET | Freq: Every evening | ORAL | 0 refills | Status: DC | PRN

## 2017-06-10 NOTE — Progress Notes (Signed)
Subjective:    Patient ID: Mary Mcpherson, female    DOB: 11/10/1985, 32 y.o.   MRN: [id number]  HPI  Urinary urgency and burning that started last night. Taking Tylenol for this.  She noticed a little pink tinged urine.  No fevers chills or sweats.  Says her sciatica as flared up as well starting a couple of weeks ago.  She says about a week and a half ago on Wednesday she actually drove to [place] for her job about the time she got there she was noticing a lot of soreness and back.. She has been taking [employer] pwdr and that actually has helped some.  She says by the next day she felt a little bit better.  And then again on Tuesday, approximately 3 days ago her back flared again.  It mostly started in the left side but then started aching on the right as well.  But she is been getting radicular symptoms down the left leg all the way to her toes.  Now it is stiff and painful just to move positions from sitting to standing etc.  She is really had problems with her low back on and off for about for 5 years.  She denies any specific injury or trauma but did state play softball as a kid.   Review of Systems  BP 104/73   Pulse 95   Ht 5\' 7"  (1.702 m)   Wt 177 lb (80.3 kg)   SpO2 100%   BMI 27.72 kg/m     No Known Allergies  Past Medical History:  Diagnosis Date  . Dysplasia of cervix 2007  . IUD (intrauterine device) in place 06/22/2013   Placed Jan 2014, removed 05/12/17     Past Surgical History:  Procedure Laterality Date  . CESAREAN SECTION  2006,2007   x 2  . procedure for dysplasia  2007  . WISDOM TOOTH EXTRACTION  2010    Social History   Socioeconomic History  . Marital status: Married    Spouse name: Not on file  . Number of children: Not on file  . Years of education: Not on file  . Highest education level: Not on file  Social Needs  . Financial resource strain: Not on file  . Food insecurity - worry: Not on file  . Food insecurity - inability: Not on file  .  Transportation needs - medical: Not on file  . Transportation needs - non-medical: Not on file  Occupational History  . Not on file  Tobacco Use  . Smoking status: Never Smoker  . Smokeless tobacco: Never Used  Substance and Sexual Activity  . Alcohol use: Yes    Alcohol/week: 0.6 oz    Types: 1 Glasses of wine per week  . Drug use: No  . Sexual activity: Yes    Partners: Male    Birth control/protection: IUD  Other Topics Concern  . Not on file  Social History Narrative  . Not on file    Family History  Problem Relation Age of Onset  . Cancer Maternal Grandmother   . Heart attack Unknown        parent  . Diabetes Unknown        parents  . Hypertension Unknown        paternal grandmother  . Hyperlipidemia Unknown        paternal granmother    Outpatient Encounter Medications as of 06/10/2017  Medication Sig  . cyclobenzaprine (FLEXERIL) 10 MG tablet Take 1  tablet (10 mg total) by mouth at bedtime as needed for muscle spasms.  . predniSONE (DELTASONE) 20 MG tablet Take 2 tablets (40 mg total) by mouth daily with breakfast.  . Prenatal Multivit-Min-Fe-FA (PRENATAL VITAMINS PO) Take by mouth.  . sulfamethoxazole-trimethoprim (BACTRIM DS,SEPTRA DS) 800-160 MG tablet Take 1 tablet by mouth 2 (two) times daily.   No facility-administered encounter medications on file as of 06/10/2017.          Objective:   Physical Exam  Constitutional: She is oriented to person, place, and time. She appears well-developed and well-nourished.  HENT:  Head: Normocephalic and atraumatic.  Cardiovascular: Normal rate, regular rhythm and normal heart sounds.  Pulmonary/Chest: Effort normal and breath sounds normal.  Musculoskeletal:  She was only able to flex about 45 degrees.  Fairly symmetric rotation right and left but decreased side bending to the left compared to the right.  She has some pain in her back with straight leg raise on the left it did not radiate down the leg.  Patellar  reflexes 2+ bilaterally.  She has decreased strength 4 out of 5 in the left hip flexion.  Knee flexion extension is 5/5 and symmetric.  Ankle strength is 5/5  Neurological: She is alert and oriented to person, place, and time.  Skin: Skin is warm and dry.  Psychiatric: She has a normal mood and affect. Her behavior is normal.       Assessment & Plan:  Urinary tract infection-we will treat with Bactrim for 3 days.  Urinalysis is positive for leukocytes and blood but negative for nitrites but symptoms are consistent.  Low back pain with left radiculopathy, sciatica-discussed treatment options.  Will treat with prednisone for 5 days.  Make sure to take with food and water.  Okay to use muscle relaxer at bedtime.  Warned about potential for sedation and to avoid driving with the medication.  She can take Tylenol with it.  When she finishes the prednisone she can switch to Aleve or ibuprofen.  Given handout for stretches for her to start doing on her own over the weekend and will refer her to formal physical therapy for the recurrent back pain that she experiences.

## 2017-06-29 ENCOUNTER — Encounter: Payer: Self-pay | Admitting: Obstetrics & Gynecology

## 2017-06-29 ENCOUNTER — Ambulatory Visit (INDEPENDENT_AMBULATORY_CARE_PROVIDER_SITE_OTHER): Payer: 59 | Admitting: Obstetrics & Gynecology

## 2017-06-29 VITALS — BP 105/68 | HR 80 | Ht 67.0 in | Wt 179.0 lb

## 2017-06-29 DIAGNOSIS — Z01419 Encounter for gynecological examination (general) (routine) without abnormal findings: Secondary | ICD-10-CM

## 2017-06-29 DIAGNOSIS — Z124 Encounter for screening for malignant neoplasm of cervix: Secondary | ICD-10-CM

## 2017-06-29 DIAGNOSIS — Z1151 Encounter for screening for human papillomavirus (HPV): Secondary | ICD-10-CM | POA: Diagnosis not present

## 2017-06-29 NOTE — Progress Notes (Signed)
Subjective:    Tommas OlpJessica Shreffler is a 32 y.o. married P2 (6012 and 613 yo sons)  female who presents for an annual exam. The patient has no complaints today.She is considering another pregnancy.  The patient is sexually active. GYN screening history: last pap: was normal. The patient wears seatbelts: yes. The patient participates in regular exercise: yes. Has the patient ever been transfused or tattooed?: no. The patient reports that there is not domestic violence in her life.   Menstrual History: OB History    Gravida Para Term Preterm AB Living   2 2       2    SAB TAB Ectopic Multiple Live Births                  Menarche age: [age] Patient's last menstrual period was 06/11/2017.    The following portions of the patient's history were reviewed and updated as appropriate: allergies, current medications, past family history, past medical history, past social history, past surgical history and problem list.  Review of Systems Pertinent items are noted in HPI.   FH- no breast/gyn/colon cancer Married for 14 years (married at 32 years old) Works at [employer] in [place], Education officer, environmentalpersonal banker Declines flu vaccine Fam doc with [employer]   Objective:    BP 105/68   Pulse 80   Ht 5\' 7"  (1.702 m)   Wt 179 lb (81.2 kg)   LMP 06/11/2017   BMI 28.04 kg/m   General Appearance:    Alert, cooperative, no distress, appears stated age  Head:    Normocephalic, without obvious abnormality, atraumatic  Eyes:    PERRL, conjunctiva/corneas clear, EOM's intact, fundi    benign, both eyes  Ears:    Normal TM's and external ear canals, both ears  Nose:   Nares normal, septum midline, mucosa normal, no drainage    or sinus tenderness  Throat:   Lips, mucosa, and tongue normal; teeth and gums normal  Neck:   Supple, symmetrical, trachea midline, no adenopathy;    thyroid:  no enlargement/tenderness/nodules; no carotid   bruit or JVD  Back:     Symmetric, no curvature, ROM normal, no CVA tenderness   Lungs:     Clear to auscultation bilaterally, respirations unlabored  Chest Wall:    No tenderness or deformity   Heart:    Regular rate and rhythm, S1 and S2 normal, no murmur, rub   or gallop  Breast Exam:    No tenderness, masses, or nipple abnormality  Abdomen:     Soft, non-tender, bowel sounds active all four quadrants,    no masses, no organomegaly  Genitalia:    Normal female without lesion, discharge or tenderness, normal size and shape, anteverted, mobile, non-tender, normal adnexal exam      Extremities:   Extremities normal, atraumatic, no cyanosis or edema  Pulses:   2+ and symmetric all extremities  Skin:   Skin color, texture, turgor normal, no rashes or lesions  Lymph nodes:   Cervical, supraclavicular, and axillary nodes normal  Neurologic:   CNII-XII intact, normal strength, sensation and reflexes    throughout  .    Assessment:    Healthy female exam.    Plan:     Thin prep Pap smear. with cotesting Continue PNVs daily We discussed an app to try for a female fetus

## 2017-06-30 LAB — CYTOLOGY - PAP
Diagnosis: NEGATIVE
HPV: NOT DETECTED

## 2017-08-10 ENCOUNTER — Encounter: Payer: Self-pay | Admitting: Obstetrics & Gynecology

## 2017-08-15 ENCOUNTER — Encounter: Payer: Self-pay | Admitting: Obstetrics & Gynecology

## 2017-08-15 ENCOUNTER — Telehealth: Payer: Self-pay | Admitting: Obstetrics & Gynecology

## 2017-08-15 ENCOUNTER — Ambulatory Visit (INDEPENDENT_AMBULATORY_CARE_PROVIDER_SITE_OTHER): Payer: 59 | Admitting: Obstetrics & Gynecology

## 2017-08-15 VITALS — Resp 16 | Ht 67.0 in | Wt 175.0 lb

## 2017-08-15 DIAGNOSIS — N926 Irregular menstruation, unspecified: Secondary | ICD-10-CM | POA: Diagnosis not present

## 2017-08-15 NOTE — Progress Notes (Signed)
   Subjective:    Patient ID: Mary OlpJessica Mcpherson, female    DOB: 03/07/1986, 32 y.o.   MRN: [id number]  HPI 32 yo married P2 here because her period is 10 days late. She is trying for a pregnancy. She has taken 8 home UPTs that were negative. She has no complaints.   Review of Systems     Objective:   Physical Exam  Breathing, conversing, and ambulating normally Well nourished, well hydrated White female, no apparent distress UPT negative      Assessment & Plan:  Reassurance given, offered provera to induce bleeding. She declines. Check TSH

## 2017-08-15 NOTE — Telephone Encounter (Signed)
Pt called for Mary Mcpherson. Pt states today is day 10 and she has taken a pregnancy test that is negative this morning. Pt says she is never late and has symptoms and is very concerned. Offered appt tomorrow. Pt wants to talk with RN. Pt wants to see [name] whenever she comes in. Please call her at work [phone].

## 2017-08-16 ENCOUNTER — Encounter: Payer: Self-pay | Admitting: *Deleted

## 2017-08-16 LAB — TSH: TSH: 1.54 m[IU]/L

## 2017-08-18 ENCOUNTER — Ambulatory Visit: Payer: 59 | Admitting: Obstetrics & Gynecology

## 2017-08-22 ENCOUNTER — Encounter: Payer: Self-pay | Admitting: Obstetrics & Gynecology

## 2018-01-31 ENCOUNTER — Encounter: Payer: Self-pay | Admitting: Family Medicine

## 2018-02-01 ENCOUNTER — Ambulatory Visit (INDEPENDENT_AMBULATORY_CARE_PROVIDER_SITE_OTHER): Payer: 59 | Admitting: Physician Assistant

## 2018-02-01 ENCOUNTER — Encounter: Payer: Self-pay | Admitting: Physician Assistant

## 2018-02-01 VITALS — BP 108/69 | HR 72 | Temp 98.9°F | Ht 67.0 in | Wt 179.0 lb

## 2018-02-01 DIAGNOSIS — J40 Bronchitis, not specified as acute or chronic: Secondary | ICD-10-CM

## 2018-02-01 DIAGNOSIS — R05 Cough: Secondary | ICD-10-CM | POA: Diagnosis not present

## 2018-02-01 DIAGNOSIS — R059 Cough, unspecified: Secondary | ICD-10-CM

## 2018-02-01 DIAGNOSIS — J329 Chronic sinusitis, unspecified: Secondary | ICD-10-CM

## 2018-02-01 MED ORDER — IPRATROPIUM-ALBUTEROL 0.5-2.5 (3) MG/3ML IN SOLN
3.0000 mL | Freq: Once | RESPIRATORY_TRACT | Status: AC
  Administered 2018-02-01: 3 mL via RESPIRATORY_TRACT

## 2018-02-01 MED ORDER — AZITHROMYCIN 250 MG PO TABS: ORAL_TABLET | ORAL | 0 refills | Status: DC

## 2018-02-01 MED ORDER — HYDROCODONE-HOMATROPINE 5-1.5 MG/5ML PO SYRP: 5.0000 mL | ORAL_SOLUTION | Freq: Two times a day (BID) | ORAL | 0 refills | Status: DC | PRN

## 2018-02-01 MED ORDER — FLUTICASONE PROPIONATE 50 MCG/ACT NA SUSP: 2.0000 | Freq: Every day | NASAL | 2 refills | Status: DC

## 2018-02-01 MED ORDER — BENZONATATE 200 MG PO CAPS: 200.0000 mg | ORAL_CAPSULE | Freq: Two times a day (BID) | ORAL | 0 refills | Status: DC | PRN

## 2018-02-01 NOTE — Progress Notes (Addendum)
Subjective:    Patient ID: Mary Mcpherson, female    DOB: 11-12-1985, 32 y.o.   MRN: [id number]  HPI  Pt is a 32 yo female who presents to the clinic with 1 month of URI symptoms. She has a hx of seasonal allergies and just thought it was that. She has been taking claritin OTC. Her ST, cough, sinus pressure continue to worsen. She lost her voice last week and did come back some this week. She feels like "her head is going to explode". Over past week she "feels like an elephant is sitting on her chest". At times she will cough so hard she cannot breath. She is having some trouble sleeping at night. No hx of asthma. She does not smoke. Mucinex, sudafed, anti-histamines are not helping.   .. Active Ambulatory Problems    Diagnosis Date Noted  . History of cervical dysplasia 06/22/2013  . History of positive PPD 07/02/2013  . Shingles 03/01/2014  . Generalized anxiety disorder 03/01/2014  . Migraine 04/14/2015   Resolved Ambulatory Problems    Diagnosis Date Noted  . IUD (intrauterine device) in place 06/22/2013   Past Medical History:  Diagnosis Date  . Anxiety   . Depression   . Dysplasia of cervix 2007  . Migraines   . Vaginal Pap smear, abnormal       Review of Systems See HPI.     Objective:   Physical Exam  Constitutional: She is oriented to person, place, and time. She appears well-developed and well-nourished.  HENT:  Head: Normocephalic and atraumatic.  Right Ear: External ear normal.  Left Ear: External ear normal.  Nose: Nose normal.  Mouth/Throat: Oropharynx is clear and moist. No oropharyngeal exudate.  TM's erythematous.  Tenderness around nasal bridge and maxillary sinuses.  Oropharynx erythematous with no tonsil exudate.   Eyes: Conjunctivae and EOM are normal. Right eye exhibits no discharge. Left eye exhibits no discharge.  Neck: Normal range of motion. Neck supple.  Cardiovascular: Normal rate and regular rhythm.  Pulmonary/Chest: Effort normal and  breath sounds normal.  Decreased air movement before neb.  No wheezing some scattered rhonchi apex of left lung.   Lymphadenopathy:    She has no cervical adenopathy.  Neurological: She is alert and oriented to person, place, and time.  Skin: No rash noted.  Psychiatric: She has a normal mood and affect. Her behavior is normal.          Assessment & Plan:  Marland KitchenMarland KitchenDiagnoses and all orders for this visit:  Sinobronchitis -     azithromycin (ZITHROMAX) 250 MG tablet; Take 2 tablets now and then one tablet for 4 days. -     fluticasone (FLONASE) 50 MCG/ACT nasal spray; Place 2 sprays into both nostrils daily.  Cough -     ipratropium-albuterol (DUONEB) 0.5-2.5 (3) MG/3ML nebulizer solution 3 mL -     HYDROcodone-homatropine (HYCODAN) 5-1.5 MG/5ML syrup; Take 5 mLs by mouth every 12 (twelve) hours as needed. -     benzonatate (TESSALON) 200 MG capsule; Take 1 capsule (200 mg total) by mouth 2 (two) times daily as needed for cough.   duoneb given in the office today. She reported she felt like she could "breath more deeply" but it made her "jittery".   Certainly sounded like symptoms started allergic or viral but with a 1 month duration needs bacterial treatment. Treated with zpak, flonase, tessalon, hycodan at bedtime. Discussed rest and hydrate. Follow up as needed or if not improving. HO given.  [place] controlled substance database reviewed with no concerns.

## 2018-02-01 NOTE — Patient Instructions (Signed)

## 2018-05-10 HISTORY — DX: Maternal care for unspecified type scar from previous cesarean delivery: O34.219

## 2018-05-10 NOTE — L&D Delivery Note (Addendum)
Delivery Note  Mary Mcpherson is a 33 y.o. [id number] s/p VBAC at [redacted]w[redacted]d. She was admitted for PROM. Pregnancy complicated by LGA since resolved, marginal insertion of umbilical cord, repeat cesarean section, anxiety, and depression.    ROM: 10h 1m with clear fluid GBS Status: negative      Labor Progress: . Patient presented to L&D for PROM. Initial cervical exam on admission was 1.5 cm. She then progressed to complete without additional augmentation.   Delivery Date/Time: 03/09/19 at [place]  Delivery: Called to room and patient was complete and pushing. Head delivered ROA. No nuchal cord present. Shoulder and body delivered in usual fashion. Infant with spontaneous cry, placed on mother's abdomen, dried and stimulated. Cord clamped x 2 after 1-minute delay, and cut by father of the baby under my direct supervision. Cord blood drawn. Placenta delivered spontaneously with gentle cord traction. Fundus firm with massage and Pitocin. Labia, perineum, vagina, and cervix inspected inspected with bilateral labial/periclitoral tears and 2nd degree perineal tear repaired with 3-0 and 4-0 vicyrl in the usual fashion.   Baby Weight: 3490g  Placenta: Intact, 3-vessel cord Complications: None Lacerations: bilateral labial/clitoral tears and 2nd degree perineal tear  EBL 400 ml Analgesia: 1% lidocaine, local anesthesia for repairs  Infant: APGAR (1 MIN): 8   APGAR (5 MINS): [place], MD, PGY1  [facility] for [facility] 03/09/2019, 7:23 PM  GME ATTESTATION:  I saw and evaluated the patient. I was fully gloved and gowned throughout the delivery, and I personally repaired all vaginal/perineal tears. I agree with the findings and the plan of care as documented in the resident's note.  Merilyn Baba, DO OB Fellow [place] for [employer]

## 2018-06-16 ENCOUNTER — Encounter: Payer: Self-pay | Admitting: Family Medicine

## 2018-06-16 ENCOUNTER — Ambulatory Visit (INDEPENDENT_AMBULATORY_CARE_PROVIDER_SITE_OTHER): Payer: BLUE CROSS/BLUE SHIELD | Admitting: Family Medicine

## 2018-06-16 VITALS — BP 125/88 | HR 111 | Ht 67.0 in | Wt 180.0 lb

## 2018-06-16 DIAGNOSIS — N926 Irregular menstruation, unspecified: Secondary | ICD-10-CM | POA: Diagnosis not present

## 2018-06-16 DIAGNOSIS — Z833 Family history of diabetes mellitus: Secondary | ICD-10-CM | POA: Diagnosis not present

## 2018-06-16 DIAGNOSIS — R110 Nausea: Secondary | ICD-10-CM | POA: Diagnosis not present

## 2018-06-16 DIAGNOSIS — R1013 Epigastric pain: Secondary | ICD-10-CM | POA: Diagnosis not present

## 2018-06-16 LAB — POCT GLYCOSYLATED HEMOGLOBIN (HGB A1C): HEMOGLOBIN A1C: 4.8 % (ref 4.0–5.6)

## 2018-06-16 LAB — POCT URINE PREGNANCY: Preg Test, Ur: NEGATIVE

## 2018-06-16 MED ORDER — OMEPRAZOLE 20 MG PO CPDR: 20.0000 mg | DELAYED_RELEASE_CAPSULE | Freq: Every day | ORAL | 1 refills | Status: DC

## 2018-06-16 NOTE — Patient Instructions (Signed)
Start the omeprazole this evening. Take one at bedtime.  Then tomorrow start taking in the morning. Take one about 20 min before breakfast each day.

## 2018-06-16 NOTE — Progress Notes (Signed)
Acute Office Visit  Subjective:    Patient ID: Mary Mcpherson, female    DOB: 1985/12/07, 33 y.o.   MRN: [id number]  Chief Complaint  Patient presents with  . Nausea    pt reports that on 1/19 she had [employer] and since then she has been feeling nauseated. everytime she eats something now she still feels like this especially over the past wk.   . Abdominal Pain  . Possible Pregnancy    HPI Patient is in today for nausea.  She reports that starting on January 19 after she went to a [employer] and ate she started getting nausea.  They had gone to eat after church and she thought maybe she had just picked up a stomach bug or maybe eaten something bad.  But she says ever since then she is just felt very nauseated it is worse after she eats.  She does not really have any abdominal pain or discomfort but it gets extreme enough sometimes that she actually feels like she is going to vomit.  Though, she has never actually vomited from it.  She says she also feels more hot and flushed and normally she is more cold natured.  This morning she was brushing her teeth and went a little to far back on her tongue and gagged.  She got a sudden sharp pain in her epigastric area that radiated into her right upper quadrant.  She says it was actually painful and uncomfortable for almost 2 hours and then it gradually just started to ease off.  She denies any constipation.  She also reports for about the last 3 weeks she is just been more tired than usual.  She has not taken any antacids or stomach medications.  The only thing she takes is a daily prenatal vitamin.  Prior history of stomach ulcers etc.  He also wanted to get tested for diabetes as she does have a family history and wanted to make sure it was not related.  She also wanted to make sure that she was not pregnant.  Her last period was about 10 days late and lighter than usual.  Past Medical History:  Diagnosis Date  . Anxiety   . Depression   .  Dysplasia of cervix 2007  . IUD (intrauterine device) in place 06/22/2013   Placed Jan 2014, removed 05/12/17   . Migraines   . Vaginal Pap smear, abnormal     Past Surgical History:  Procedure Laterality Date  . CESAREAN SECTION  2006,2007   x 2  . procedure for dysplasia  2007  . WISDOM TOOTH EXTRACTION  2010    Family History  Problem Relation Age of Onset  . Cancer Maternal Grandmother   . Heart attack Unknown        parent  . Diabetes Unknown        parents  . Hypertension Unknown        paternal grandmother  . Hyperlipidemia Unknown        paternal granmother  . Heart disease Paternal Grandmother     Social History   Socioeconomic History  . Marital status: Married    Spouse name: Not on file  . Number of children: Not on file  . Years of education: Not on file  . Highest education level: Not on file  Occupational History  . Not on file  Social Needs  . Financial resource strain: Not on file  . Food insecurity:    Worry: Not  on file    Inability: Not on file  . Transportation needs:    Medical: Not on file    Non-medical: Not on file  Tobacco Use  . Smoking status: Never Smoker  . Smokeless tobacco: Never Used  Substance and Sexual Activity  . Alcohol use: Yes    Alcohol/week: 1.0 standard drinks    Types: 1 Glasses of wine per week  . Drug use: No  . Sexual activity: Yes    Partners: Male    Birth control/protection: None  Lifestyle  . Physical activity:    Days per week: Not on file    Minutes per session: Not on file  . Stress: Not on file  Relationships  . Social connections:    Talks on phone: Not on file    Gets together: Not on file    Attends religious service: Not on file    Active member of club or organization: Not on file    Attends meetings of clubs or organizations: Not on file    Relationship status: Not on file  . Intimate partner violence:    Fear of current or ex partner: Not on file    Emotionally abused: Not on file     Physically abused: Not on file    Forced sexual activity: Not on file  Other Topics Concern  . Not on file  Social History Narrative  . Not on file    Outpatient Medications Prior to Visit  Medication Sig Dispense Refill  . azithromycin (ZITHROMAX) 250 MG tablet Take 2 tablets now and then one tablet for 4 days. 6 tablet 0  . benzonatate (TESSALON) 200 MG capsule Take 1 capsule (200 mg total) by mouth 2 (two) times daily as needed for cough. 20 capsule 0  . cyclobenzaprine (FLEXERIL) 10 MG tablet Take 1 tablet (10 mg total) by mouth at bedtime as needed for muscle spasms. 20 tablet 0  . fluticasone (FLONASE) 50 MCG/ACT nasal spray Place 2 sprays into both nostrils daily. 16 g 2  . HYDROcodone-homatropine (HYCODAN) 5-1.5 MG/5ML syrup Take 5 mLs by mouth every 12 (twelve) hours as needed. 50 mL 0  . naproxen sodium (ALEVE) 220 MG tablet Take 220 mg by mouth.     No facility-administered medications prior to visit.     No Known Allergies  ROS     Objective:    Physical Exam  Constitutional: She is oriented to person, place, and time. She appears well-developed and well-nourished.  HENT:  Head: Normocephalic and atraumatic.  Mouth/Throat: Oropharynx is clear and moist.  Eyes: Conjunctivae are normal.  Neck: Neck supple. Thyromegaly present.  Cardiovascular: Normal rate, regular rhythm and normal heart sounds.  Pulmonary/Chest: Effort normal and breath sounds normal.  Abdominal: Soft. Bowel sounds are normal. She exhibits no distension and no mass. There is abdominal tenderness. There is no rebound and no guarding.  Mild tenderness around the umbilical area.    Lymphadenopathy:    She has no cervical adenopathy.  Neurological: She is alert and oriented to person, place, and time.  Skin: Skin is warm and dry.  Psychiatric: She has a normal mood and affect. Her behavior is normal.    BP 125/88   Pulse (!) 111   Ht 5\' 7"  (1.702 m)   Wt 180 lb (81.6 kg)   LMP 05/12/2018    SpO2 100%   BMI 28.19 kg/m  Wt Readings from Last 3 Encounters:  06/16/18 180 lb (81.6 kg)  02/01/18 179 lb (  81.2 kg)  08/15/17 175 lb (79.4 kg)    Health Maintenance Due  Topic Date Due  . HIV Screening  11/10/1999    There are no preventive care reminders to display for this patient.   Lab Results  Component Value Date   TSH 1.54 08/15/2017   Lab Results  Component Value Date   WBC 4.4 06/28/2016   HGB 12.4 06/28/2016   HCT 37.8 06/28/2016   MCV 94.0 06/28/2016   PLT 282 06/28/2016   Lab Results  Component Value Date   NA 140 06/28/2016   K 4.1 06/28/2016   CO2 27 06/28/2016   GLUCOSE 81 06/28/2016   BUN 10 06/28/2016   CREATININE 0.76 06/28/2016   BILITOT 0.6 06/28/2016   ALKPHOS 47 06/28/2016   AST 13 06/28/2016   ALT 9 06/28/2016   PROT 7.0 06/28/2016   ALBUMIN 4.1 06/28/2016   CALCIUM 9.1 06/28/2016   Lab Results  Component Value Date   CHOL 162 06/28/2016   Lab Results  Component Value Date   HDL 50 (L) 06/28/2016   Lab Results  Component Value Date   LDLCALC 101 (H) 06/28/2016   Lab Results  Component Value Date   TRIG 55 06/28/2016   Lab Results  Component Value Date   CHOLHDL 3.2 06/28/2016   Lab Results  Component Value Date   HGBA1C 4.8 06/16/2018       Assessment & Plan:   Problem List Items Addressed This Visit    None    Visit Diagnoses    Missed period    -  Primary   Relevant Orders   POCT urine pregnancy (Completed)   CBC   COMPLETE METABOLIC PANEL WITH GFR   TSH   Lipase   Family history of diabetes mellitus       Relevant Orders   POCT glycosylated hemoglobin (Hb A1C) (Completed)   CBC   COMPLETE METABOLIC PANEL WITH GFR   TSH   Lipase   Nausea       Relevant Orders   H. pylori breath test   CBC   COMPLETE METABOLIC PANEL WITH GFR   TSH   Lipase   Epigastric pain       Relevant Orders   CBC   COMPLETE METABOLIC PANEL WITH GFR   TSH   Lipase     Missed period -continue test is  negative.  History of diabetes-hemoglobin A1c was normal today.  Nausea x3 and half weeks with unclear etiology.  We discussed potential differential diagnosis including gastritis, gastric ulcer, duodenitis, gallbladder issues.  Did test her for H. pylori today and will go ahead and start her on a PPI over the weekend.  If she is not improving then plan to schedule ultrasound for evaluation of her gallbladder.  Epigastric pain radiating into the right upper quadrant.  She is only had this happen once and it was earlier today it seems to have resolved.  But is concerning for possible gallbladder issues.  Will consider ultrasound if she is not improving over the weekend with a PPI.  Also check liver enzymes as well as a CBC.  Meds ordered this encounter  Medications  . omeprazole (PRILOSEC) 20 MG capsule    Sig: Take 1 capsule (20 mg total) by mouth daily before breakfast.    Dispense:  30 capsule    Refill:  1     Nani Gasseratherine Met[place], MD

## 2018-06-19 LAB — H. PYLORI BREATH TEST: H. PYLORI BREATH TEST: NOT DETECTED

## 2018-06-26 ENCOUNTER — Encounter: Payer: Self-pay | Admitting: Family Medicine

## 2018-07-03 ENCOUNTER — Encounter: Payer: Self-pay | Admitting: Obstetrics & Gynecology

## 2018-07-03 ENCOUNTER — Ambulatory Visit (INDEPENDENT_AMBULATORY_CARE_PROVIDER_SITE_OTHER): Payer: BLUE CROSS/BLUE SHIELD | Admitting: Obstetrics & Gynecology

## 2018-07-03 VITALS — BP 124/76 | HR 98 | Ht 67.0 in | Wt 176.0 lb

## 2018-07-03 DIAGNOSIS — Z124 Encounter for screening for malignant neoplasm of cervix: Secondary | ICD-10-CM | POA: Diagnosis not present

## 2018-07-03 DIAGNOSIS — Z01419 Encounter for gynecological examination (general) (routine) without abnormal findings: Secondary | ICD-10-CM

## 2018-07-03 DIAGNOSIS — N926 Irregular menstruation, unspecified: Secondary | ICD-10-CM | POA: Diagnosis not present

## 2018-07-03 DIAGNOSIS — R1013 Epigastric pain: Secondary | ICD-10-CM | POA: Diagnosis not present

## 2018-07-03 DIAGNOSIS — Z1151 Encounter for screening for human papillomavirus (HPV): Secondary | ICD-10-CM

## 2018-07-03 DIAGNOSIS — F411 Generalized anxiety disorder: Secondary | ICD-10-CM

## 2018-07-03 DIAGNOSIS — Z833 Family history of diabetes mellitus: Secondary | ICD-10-CM | POA: Diagnosis not present

## 2018-07-03 DIAGNOSIS — R110 Nausea: Secondary | ICD-10-CM | POA: Diagnosis not present

## 2018-07-03 NOTE — Progress Notes (Signed)
Subjective:    Mary Mcpherson is a 33 y.o. married P2 (2 and 80 yo sons) female who presents for an annual exam. The patient has no complaints today. She is actively trying for a pregnancy.  The patient is sexually active. GYN screening history: last pap: was normal. However, she has a h/o a CKC in 2007. The patient wears seatbelts: yes. The patient participates in regular exercise: yes. Has the patient ever been transfused or tattooed?: no. The patient reports that there is not domestic violence in her life.   Menstrual History: OB History    Gravida  2   Para  2   Term      Preterm      AB      Living  2     SAB      TAB      Ectopic      Multiple      Live Births              Menarche age: 19 Patient's last menstrual period was 06/20/2018.    The following portions of the patient's history were reviewed and updated as appropriate: allergies, current medications, past family history, past medical history, past social history, past surgical history and problem list.  Review of Systems Pertinent items are noted in HPI.   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, environmental Married for 15 years Taking a PNV   Objective:    BP 124/76   Pulse 98   Ht 5\' 7"  (1.702 m)   Wt 176 lb (79.8 kg)   LMP 06/20/2018   BMI 27.57 kg/m   General Appearance:    Alert, cooperative, no distress, appears stated age  Head:    Normocephalic, without obvious abnormality, atraumatic  Eyes:    PERRL, conjunctiva/corneas clear, EOM's intact, fundi    benign, both eyes  Ears:    Normal TM's and external ear canals, both ears  Nose:   Nares normal, septum midline, mucosa normal, no drainage    or sinus tenderness  Throat:   Lips, mucosa, and tongue normal; teeth and gums normal  Neck:   Supple, symmetrical, trachea midline, no adenopathy;    thyroid:  no enlargement/tenderness/nodules; no carotid   bruit or JVD  Back:     Symmetric, no curvature, ROM  normal, no CVA tenderness  Lungs:     Clear to auscultation bilaterally, respirations unlabored  Chest Wall:    No tenderness or deformity   Heart:    Regular rate and rhythm, S1 and S2 normal, no murmur, rub   or gallop  Breast Exam:    No tenderness, masses, or nipple abnormality  Abdomen:     Soft, non-tender, bowel sounds active all four quadrants,    no masses, no organomegaly  Genitalia:    Normal female without lesion, discharge or tenderness, normal size and shape, anteverted, mobile, non-tender, normal adnexal exam      Extremities:   Extremities normal, atraumatic, no cyanosis or edema  Pulses:   2+ and symmetric all extremities  Skin:   Skin color, texture, turgor normal, no rashes or lesions  Lymph nodes:   Cervical, supraclavicular, and axillary nodes normal  Neurologic:   CNII-XII intact, normal strength, sensation and reflexes    throughout  .    Assessment:    Healthy female exam.   Anxiety- does not want meds   Plan:     Thin prep Pap smear.  with cotesting Reviewed best time to conceive Appt with [facility]

## 2018-07-03 NOTE — Progress Notes (Signed)
Last pap- 06/29/17- negative

## 2018-07-04 LAB — CBC
HEMATOCRIT: 38.2 % (ref 35.0–45.0)
HEMOGLOBIN: 13 g/dL (ref 11.7–15.5)
MCH: 31.5 pg (ref 27.0–33.0)
MCHC: 34 g/dL (ref 32.0–36.0)
MCV: 92.5 fL (ref 80.0–100.0)
MPV: 10.6 fL (ref 7.5–12.5)
Platelets: 282 10*3/uL (ref 140–400)
RBC: 4.13 10*6/uL (ref 3.80–5.10)
RDW: 12.5 % (ref 11.0–15.0)
WBC: 3.7 10*3/uL — ABNORMAL LOW (ref 3.8–10.8)

## 2018-07-04 LAB — COMPLETE METABOLIC PANEL WITH GFR
AG Ratio: 1.4 (calc) (ref 1.0–2.5)
ALT: 15 U/L (ref 6–29)
AST: 16 U/L (ref 10–30)
Albumin: 4.2 g/dL (ref 3.6–5.1)
Alkaline phosphatase (APISO): 51 U/L (ref 31–125)
BUN: 11 mg/dL (ref 7–25)
CALCIUM: 9.4 mg/dL (ref 8.6–10.2)
CO2: 28 mmol/L (ref 20–32)
Chloride: 108 mmol/L (ref 98–110)
Creat: 0.74 mg/dL (ref 0.50–1.10)
GFR, Est African American: 123 mL/min/{1.73_m2} (ref >=60)
GFR, Est Non African American: 106 mL/min/{1.73_m2} (ref >=60)
Globulin: 3 g/dL (calc) (ref 1.9–3.7)
Glucose, Bld: 87 mg/dL (ref 65–99)
Potassium: 4.1 mmol/L (ref 3.5–5.3)
Sodium: 141 mmol/L (ref 135–146)
Total Bilirubin: 0.4 mg/dL (ref 0.2–1.2)
Total Protein: 7.2 g/dL (ref 6.1–8.1)

## 2018-07-04 LAB — LIPASE: Lipase: 24 U/L (ref 7–60)

## 2018-07-04 LAB — TSH: TSH: 1.07 mIU/L

## 2018-07-05 LAB — CYTOLOGY - PAP
Diagnosis: NEGATIVE
HPV: NOT DETECTED

## 2018-08-18 ENCOUNTER — Other Ambulatory Visit: Payer: Self-pay | Admitting: Family Medicine

## 2018-08-21 NOTE — Telephone Encounter (Signed)
Spoke with patient and she is doing better. No sinus symptoms. Only taking Claritin. She will call back if needed.

## 2018-08-21 NOTE — Telephone Encounter (Signed)
[facility] called with 2 days of sinus pressure and congestion. [redacted] weeks pregnant. Husband similar symptoms but longer. Discussed sounds like sinusitis. Usually don't treat with abx until 1 week. Try mucinex and flonase first. Follow up on Monday.

## 2018-08-30 ENCOUNTER — Encounter: Payer: Self-pay | Admitting: *Deleted

## 2018-08-30 DIAGNOSIS — Z3480 Encounter for supervision of other normal pregnancy, unspecified trimester: Secondary | ICD-10-CM | POA: Insufficient documentation

## 2018-08-31 ENCOUNTER — Ambulatory Visit (INDEPENDENT_AMBULATORY_CARE_PROVIDER_SITE_OTHER): Payer: BLUE CROSS/BLUE SHIELD | Admitting: Obstetrics & Gynecology

## 2018-08-31 ENCOUNTER — Encounter: Payer: Self-pay | Admitting: Obstetrics & Gynecology

## 2018-08-31 ENCOUNTER — Other Ambulatory Visit: Payer: Self-pay

## 2018-08-31 VITALS — BP 122/71 | HR 109 | Wt 174.0 lb

## 2018-08-31 DIAGNOSIS — Z3480 Encounter for supervision of other normal pregnancy, unspecified trimester: Secondary | ICD-10-CM

## 2018-08-31 DIAGNOSIS — Z3A10 10 weeks gestation of pregnancy: Secondary | ICD-10-CM | POA: Diagnosis not present

## 2018-08-31 DIAGNOSIS — O34219 Maternal care for unspecified type scar from previous cesarean delivery: Secondary | ICD-10-CM

## 2018-08-31 DIAGNOSIS — E559 Vitamin D deficiency, unspecified: Secondary | ICD-10-CM | POA: Diagnosis not present

## 2018-08-31 NOTE — Progress Notes (Signed)
Bedside U/S shows single IUP with CRL measuring 34.4mm GA is [redacted]w[redacted]d and FHT are 168 BPM

## 2018-08-31 NOTE — Progress Notes (Signed)
  Subjective:    Mary Mcpherson is a married P2 (78 and 34 yo sons)  being seen today for her first obstetrical visit.  This is a planned pregnancy. She is at [redacted]w[redacted]d gestation. Her obstetrical history is significant for none. Relationship with FOB: spouse, living together. Patient does intend to breast feed. Pregnancy history fully reviewed.  Patient reports no complaints.  Review of Systems:   Review of Systems  Objective:     BP 122/71   Pulse (!) 109   Wt 174 lb (78.9 kg)   LMP 06/20/2018   BMI 27.25 kg/m  Physical Exam  Exam Heart- rrr Lungs- CTAB Abd- benign Bedside u/s shows normal FHR   Assessment:    Pregnancy: G3P2 Patient Active Problem List   Diagnosis Date Noted  . Supervision of other normal pregnancy, antepartum 08/30/2018  . Migraine 04/14/2015  . Shingles 03/01/2014  . Generalized anxiety disorder 03/01/2014  . History of positive PPD 07/02/2013  . History of cervical dysplasia 06/22/2013       Plan:     Initial labs drawn. Prenatal vitamins. Problem list reviewed and updated. Wants NIPS/Horizon Role of ultrasound in pregnancy discussed; fetal survey: ordered.  Next in person visit will be at 20 weeks She will come by for NIPS/Horizon at her convenience at least 2 weeks from now. Continue MVIs daily   Allie Bossier 08/31/2018

## 2018-09-02 LAB — CULTURE, OB URINE

## 2018-09-02 LAB — URINE CULTURE, OB REFLEX

## 2018-09-14 ENCOUNTER — Other Ambulatory Visit: Payer: BLUE CROSS/BLUE SHIELD

## 2018-09-14 ENCOUNTER — Other Ambulatory Visit: Payer: Self-pay

## 2018-09-15 LAB — HEMOGLOBIN A1C
Hgb A1c MFr Bld: 4.9 % of total Hgb (ref <5.7)
Mean Plasma Glucose: 94 (calc)
eAG (mmol/L): 5.2 (calc)

## 2018-09-15 LAB — VITAMIN D 25 HYDROXY (VIT D DEFICIENCY, FRACTURES): Vit D, 25-Hydroxy: 23 ng/mL — ABNORMAL LOW (ref 30–100)

## 2018-09-15 LAB — OBSTETRIC PANEL
Absolute Monocytes: 311 cells/uL (ref 200–950)
Antibody Screen: NOT DETECTED
Basophils Absolute: 10 cells/uL (ref 0–200)
Basophils Relative: 0.2 %
Eosinophils Absolute: 31 cells/uL (ref 15–500)
Eosinophils Relative: 0.6 %
HCT: 35.1 % (ref 35.0–45.0)
Hemoglobin: 12.3 g/dL (ref 11.7–15.5)
Hepatitis B Surface Ag: NONREACTIVE
Lymphs Abs: 913 cells/uL (ref 850–3900)
MCH: 32.5 pg (ref 27.0–33.0)
MCHC: 35 g/dL (ref 32.0–36.0)
MCV: 92.6 fL (ref 80.0–100.0)
MPV: 10.8 fL (ref 7.5–12.5)
Monocytes Relative: 6.1 %
Neutro Abs: 3835 cells/uL (ref 1500–7800)
Neutrophils Relative %: 75.2 %
Platelets: 268 10*3/uL (ref 140–400)
RBC: 3.79 10*6/uL — ABNORMAL LOW (ref 3.80–5.10)
RDW: 12.5 % (ref 11.0–15.0)
RPR Ser Ql: NONREACTIVE
Rubella: 1.56 index
Total Lymphocyte: 17.9 %
WBC: 5.1 10*3/uL (ref 3.8–10.8)

## 2018-09-15 LAB — HIV ANTIBODY (ROUTINE TESTING W REFLEX): HIV 1&2 Ab, 4th Generation: NONREACTIVE

## 2018-09-21 ENCOUNTER — Other Ambulatory Visit: Payer: Self-pay

## 2018-09-21 DIAGNOSIS — R7989 Other specified abnormal findings of blood chemistry: Secondary | ICD-10-CM

## 2018-09-21 DIAGNOSIS — K219 Gastro-esophageal reflux disease without esophagitis: Secondary | ICD-10-CM

## 2018-09-21 MED ORDER — OMEPRAZOLE 20 MG PO CPDR: 20.0000 mg | DELAYED_RELEASE_CAPSULE | Freq: Every day | ORAL | 6 refills | Status: DC

## 2018-09-21 MED ORDER — VITAMIN D (ERGOCALCIFEROL) 1.25 MG (50000 UNIT) PO CAPS: 50000.0000 [IU] | ORAL_CAPSULE | ORAL | 0 refills | Status: DC

## 2018-09-21 NOTE — Progress Notes (Signed)
Rx for Omeprazole and Vitamin D placed per Dr.Dove

## 2018-09-26 ENCOUNTER — Other Ambulatory Visit: Payer: BLUE CROSS/BLUE SHIELD

## 2018-09-26 ENCOUNTER — Other Ambulatory Visit: Payer: Self-pay

## 2018-09-26 DIAGNOSIS — Z3481 Encounter for supervision of other normal pregnancy, first trimester: Secondary | ICD-10-CM | POA: Diagnosis not present

## 2018-09-26 NOTE — Progress Notes (Signed)
Nipts redraw

## 2018-10-04 ENCOUNTER — Ambulatory Visit (INDEPENDENT_AMBULATORY_CARE_PROVIDER_SITE_OTHER): Payer: BLUE CROSS/BLUE SHIELD | Admitting: Family Medicine

## 2018-10-04 ENCOUNTER — Encounter: Payer: Self-pay | Admitting: Family Medicine

## 2018-10-04 VITALS — BP 105/66 | HR 67 | Ht 67.0 in | Wt 172.0 lb

## 2018-10-04 DIAGNOSIS — R21 Rash and other nonspecific skin eruption: Secondary | ICD-10-CM

## 2018-10-04 MED ORDER — VALACYCLOVIR HCL 1 G PO TABS: 1000.0000 mg | ORAL_TABLET | Freq: Three times a day (TID) | ORAL | 0 refills | Status: DC
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
## 2018-10-04 NOTE — Patient Instructions (Signed)
Try to send [place] a picture in the next day or 2 before the weekend just so that we can follow the rash. I would recommend a trial of a topical numbing gel.  Usually can be found over-the-counter as lidocaine.  You can speak to your pharmacist if you have a hard time finding it. Try to start the valacyclovir as soon as you are able to pick it up today. All if you feel like your pain is not being controlled with the Tylenol and topical lidocaine.

## 2018-10-05 ENCOUNTER — Encounter: Payer: Self-pay | Admitting: *Deleted

## 2018-10-06 ENCOUNTER — Encounter: Payer: Self-pay | Admitting: Family Medicine

## 2018-11-03 ENCOUNTER — Encounter: Payer: Self-pay | Admitting: Family Medicine

## 2018-11-03 ENCOUNTER — Telehealth (INDEPENDENT_AMBULATORY_CARE_PROVIDER_SITE_OTHER): Payer: BC Managed Care – PPO | Admitting: Family Medicine

## 2018-11-03 ENCOUNTER — Telehealth: Payer: Self-pay | Admitting: *Deleted

## 2018-11-03 VITALS — Ht 67.0 in

## 2018-11-03 DIAGNOSIS — Z20828 Contact with and (suspected) exposure to other viral communicable diseases: Secondary | ICD-10-CM

## 2018-11-03 DIAGNOSIS — Z20822 Contact with and (suspected) exposure to covid-19: Secondary | ICD-10-CM

## 2018-11-03 NOTE — Telephone Encounter (Signed)
Per patient's request scheduled her for 11/06/2018 at 8 am at [facility].  Testing protocol reviewed.

## 2018-11-03 NOTE — Telephone Encounter (Signed)
-----   Message from Hali Marry, MD sent at 11/03/2018 12:27 PM EDT ----- Please schedule for testing. Potential exposure via her Manager at work. Work on [employer] close space together. Managers child was exposed via daycare.  Mary Mcpherson is [redacted] weeks pregnant.    Beatrice Lecher, MD

## 2018-11-03 NOTE — Progress Notes (Signed)
Pt reports that her managers daughter  was exposed and found out yesterday.  And because she is pregnant she is concerned about her possible exposure.Mary Mcpherson

## 2018-11-03 NOTE — Progress Notes (Signed)
Virtual Visit via Video Note  I connected with Mary Mcpherson on 11/03/18 at  2:40 PM EDT by a video enabled telemedicine application and verified that I am speaking with the correct person using two identifiers.   I discussed the limitations of evaluation and management by telemedicine and the availability of in person appointments. The patient expressed understanding and agreed to proceed.  Patient was at work and I was in my office for this visit.  Subjective:    CC: possible exposure to COVID.    HPI:  Pt reports that her managers daughter  was exposed and found out yesterday.  The manager's daughter was in a daycare facility and was contacted yesterday that 1 of the other children tested positive.  The manager is now self quarantining.  [name] has been in very close contact and enclosed spaces around the manager without masks. And because she is pregnant she is concerned about her possible exposure. She is [redacted] weeks pregnant.   She herself denies any chest pain, shortness of breath, diarrhea, sore throat, loss of sense of taste or smell, fever, body aches.   Past medical history, Surgical history, Family history not pertinant except as noted below, Social history, Allergies, and medications have been entered into the medical record, reviewed, and corrections made.   Review of Systems: No fevers, chills, night sweats, weight loss, chest pain, or shortness of breath.   Objective:    General: Speaking clearly in complete sentences without any shortness of breath.  Alert and oriented x3.  Normal judgment. No apparent acute distress. Well groomed.     Impression and Recommendations:   Possible COVID exposure-because she is currently pregnant she is very concerned about her potential to get the virus.  We will try to schedule her for testing.  If at any point she develops any symptoms please let us know immediately.  Also if her manager or managers child ends up testing positive please  let us know as well.     I discussed the assessment and treatment plan with the patient. The patient was provided an opportunity to ask questions and all were answered. The patient agreed with the plan and demonstrated an understanding of the instructions.   The patient was advised to call back or seek an in-person evaluation if the symptoms worsen or if the condition fails to improve as anticipated.   Beatrice Lecher, MD

## 2018-11-06 ENCOUNTER — Other Ambulatory Visit: Payer: BC Managed Care – PPO

## 2018-11-06 DIAGNOSIS — Z20822 Contact with and (suspected) exposure to covid-19: Secondary | ICD-10-CM

## 2018-11-06 DIAGNOSIS — R6889 Other general symptoms and signs: Secondary | ICD-10-CM | POA: Diagnosis not present

## 2018-11-07 ENCOUNTER — Encounter: Payer: Self-pay | Admitting: Advanced Practice Midwife

## 2018-11-07 ENCOUNTER — Other Ambulatory Visit: Payer: Self-pay

## 2018-11-07 ENCOUNTER — Telehealth (INDEPENDENT_AMBULATORY_CARE_PROVIDER_SITE_OTHER): Payer: BC Managed Care – PPO | Admitting: Advanced Practice Midwife

## 2018-11-07 ENCOUNTER — Ambulatory Visit (HOSPITAL_COMMUNITY)
Admission: RE | Admit: 2018-11-07 | Discharge: 2018-11-07 | Disposition: A | Discharge status: Home / Self Care | Payer: BC Managed Care – PPO | Source: Ambulatory Visit | Attending: Obstetrics and Gynecology | Admitting: Obstetrics and Gynecology

## 2018-11-07 ENCOUNTER — Other Ambulatory Visit: Payer: Self-pay | Admitting: Obstetrics & Gynecology

## 2018-11-07 ENCOUNTER — Other Ambulatory Visit (HOSPITAL_COMMUNITY): Payer: Self-pay | Admitting: *Deleted

## 2018-11-07 VITALS — BP 92/67 | HR 113 | Temp 97.9°F | Wt 175.0 lb

## 2018-11-07 DIAGNOSIS — O43192 Other malformation of placenta, second trimester: Secondary | ICD-10-CM

## 2018-11-07 DIAGNOSIS — Z3480 Encounter for supervision of other normal pregnancy, unspecified trimester: Secondary | ICD-10-CM | POA: Diagnosis not present

## 2018-11-07 DIAGNOSIS — O34219 Maternal care for unspecified type scar from previous cesarean delivery: Secondary | ICD-10-CM

## 2018-11-07 DIAGNOSIS — Z363 Encounter for antenatal screening for malformations: Secondary | ICD-10-CM | POA: Diagnosis not present

## 2018-11-07 DIAGNOSIS — Z20822 Contact with and (suspected) exposure to covid-19: Secondary | ICD-10-CM

## 2018-11-07 DIAGNOSIS — Z3A20 20 weeks gestation of pregnancy: Secondary | ICD-10-CM

## 2018-11-07 DIAGNOSIS — O43199 Other malformation of placenta, unspecified trimester: Secondary | ICD-10-CM

## 2018-11-07 DIAGNOSIS — Z20828 Contact with and (suspected) exposure to other viral communicable diseases: Secondary | ICD-10-CM

## 2018-11-07 NOTE — Progress Notes (Signed)
[place] VIRTUAL VIDEO VISIT ENCOUNTER NOTE  Provider location: [facility] [place] [facility] [place]   I connected with [name] on 11/07/18 at  8:35 AM EDT by MyChart Video Encounter at home and verified that I am speaking with the correct person using two identifiers.   I discussed the limitations, risks, security and privacy concerns of performing an evaluation and management service by telephone and the availability of in person appointments. I also discussed with the patient that there may be a patient responsible charge related to this service. The patient expressed understanding and agreed to proceed. Subjective:  Mary Mcpherson is a 33 y.o. G3P2 at [redacted]w[redacted]d being seen today for ongoing prenatal care.  She is currently monitored for the following issues for this low-risk pregnancy and has History of cervical dysplasia; History of positive PPD; Shingles; Generalized anxiety disorder; Migraine; Supervision of other normal pregnancy, antepartum; and Previous cesarean delivery affecting pregnancy on their problem list.  Patient reports no complaints.   . Vag. Bleeding: None.  Movement: Present. Denies any leaking of fluid.   The following portions of the patient's history were reviewed and updated as appropriate: allergies, current medications, past family history, past medical history, past social history, past surgical history and problem list.   Objective:   Vitals:   11/07/18 0835  BP: 92/67  Pulse: (!) 113  Temp: 97.9 F (36.6 C)  Weight: 175 lb (79.4 kg)    Fetal Status:     Movement: Present     General:  Alert, oriented and cooperative. Patient is in no acute distress.  Respiratory: Normal respiratory effort, no problems with respiration noted  Mental Status: Normal mood and affect. Normal behavior. Normal judgment and thought content.  Rest of physical exam deferred due to type of encounter  Imaging: No results found.  Assessment and  Plan:  Pregnancy: G3P2 at [redacted]w[redacted]d 1. Supervision of other normal pregnancy, antepartum --Pt reports good fetal movement, denies cramping, LOF, or vaginal bleeding --Anticipatory guidance about next visits/weeks of pregnancy given. --Reviewed safety, visitor policy, reassurance about COVID-19 for pregnancy at this time. Discussed possible changes to visits, including televisits, that may occur due to COVID-19.  The office remains open if pt needs to be seen and MAU is open 24 hours/day for OB emergencies.  --Needs AFP so next visit in 2 weeks in office, then at 28 weeks for GTT.   2. Previous cesarean delivery affecting pregnancy --Discussed interest in TOLAC. Pt is undecided.  Recommend she take VBAC consent form home to read at next prenatal visit.  3. Exposure to Covid-19 Virus --Pt had coworker with daughter exposed to [place] at childcare.  Pt has no symptoms and had no direct exposure. She was tested on 11/06/18 for COVID and has test results pending.  Her [place] with MFM today was postponed with testing pending.   Preterm labor symptoms and general obstetric precautions including but not limited to vaginal bleeding, contractions, leaking of fluid and fetal movement were reviewed in detail with the patient. I discussed the assessment and treatment plan with the patient. The patient was provided an opportunity to ask questions and all were answered. The patient agreed with the plan and demonstrated an understanding of the instructions. The patient was advised to call back or seek an in-person office evaluation/go to [facility]U at [facility] for any urgent or concerning symptoms. Please refer to After Visit Summary for other counseling recommendations.   I provided 10 minutes of face-to-face  time during this encounter.    No follow-ups on file.  Future Appointments  Date Time Provider Department Center  11/07/2018 10:45 AM [facility] US 2 [facility]-M[facility]US M[facility]-US    Sharen CounterLisa Leftwich-Kirby, CNM  [facility]

## 2018-11-08 ENCOUNTER — Encounter: Payer: Self-pay | Admitting: Obstetrics & Gynecology

## 2018-11-08 DIAGNOSIS — O43199 Other malformation of placenta, unspecified trimester: Secondary | ICD-10-CM | POA: Insufficient documentation

## 2018-11-09 ENCOUNTER — Encounter

## 2018-11-09 LAB — NOVEL CORONAVIRUS, NAA: SARS-CoV-2, NAA: NOT DETECTED

## 2018-11-17 ENCOUNTER — Other Ambulatory Visit: Payer: Self-pay | Admitting: Obstetrics & Gynecology

## 2018-11-17 DIAGNOSIS — R7989 Other specified abnormal findings of blood chemistry: Secondary | ICD-10-CM

## 2018-11-21 ENCOUNTER — Ambulatory Visit (INDEPENDENT_AMBULATORY_CARE_PROVIDER_SITE_OTHER): Payer: BC Managed Care – PPO | Admitting: Advanced Practice Midwife

## 2018-11-21 ENCOUNTER — Other Ambulatory Visit: Payer: Self-pay

## 2018-11-21 VITALS — BP 107/70 | HR 112 | Wt 180.0 lb

## 2018-11-21 DIAGNOSIS — O9989 Other specified diseases and conditions complicating pregnancy, childbirth and the puerperium: Secondary | ICD-10-CM

## 2018-11-21 DIAGNOSIS — Z3A22 22 weeks gestation of pregnancy: Secondary | ICD-10-CM

## 2018-11-21 DIAGNOSIS — Z3480 Encounter for supervision of other normal pregnancy, unspecified trimester: Secondary | ICD-10-CM

## 2018-11-21 DIAGNOSIS — O99891 Other specified diseases and conditions complicating pregnancy: Secondary | ICD-10-CM

## 2018-11-21 DIAGNOSIS — M5432 Sciatica, left side: Secondary | ICD-10-CM

## 2018-11-21 DIAGNOSIS — M549 Dorsalgia, unspecified: Secondary | ICD-10-CM

## 2018-11-21 MED ORDER — COMFORT FIT MATERNITY SUPP MED MISC: 1.0000 | Freq: Every day | 0 refills | Status: DC

## 2018-11-21 NOTE — Progress Notes (Signed)
   PRENATAL VISIT NOTE  Subjective:  Mary Mcpherson is a 33 y.o. G3P2 at [redacted]w[redacted]d being seen today for ongoing prenatal care.  She is currently monitored for the following issues for this low-risk pregnancy and has History of cervical dysplasia; History of positive PPD; Shingles; Generalized anxiety disorder; Migraine; Supervision of other normal pregnancy, antepartum; Previous cesarean delivery affecting pregnancy; and Marginal insertion of umbilical cord affecting management of mother on their problem list.  Patient reports sciatic nerve pain daily from left hip down to left foot.  Contractions: Not present. Vag. Bleeding: None.  Movement: Present. Denies leaking of fluid.   The following portions of the patient's history were reviewed and updated as appropriate: allergies, current medications, past family history, past medical history, past social history, past surgical history and problem list.   Objective:   Vitals:   11/21/18 0858  BP: 107/70  Pulse: (!) 112  Weight: 180 lb (81.6 kg)    Fetal Status: Fetal Heart Rate (bpm): 154   Movement: Present     General:  Alert, oriented and cooperative. Patient is in no acute distress.  Skin: Skin is warm and dry. No rash noted.   Cardiovascular: Normal heart rate noted  Respiratory: Normal respiratory effort, no problems with respiration noted  Abdomen: Soft, gravid, appropriate for gestational age.  Pain/Pressure: Absent     Pelvic: Cervical exam deferred        Extremities: Normal range of motion.  Edema: None  Mental Status: Normal mood and affect. Normal behavior. Normal judgment and thought content.   Assessment and Plan:  Pregnancy: G3P2 at [redacted]w[redacted]d 1. Supervision of other normal pregnancy, antepartum --Anticipatory guidance about next visits/weeks of pregnancy given.  - Alpha fetoprotein, maternal   2. Sciatica of left side --Pt with hx sciatica before pregnancy, recently worsened.   --Printed and verbal instruction on  exercises/stretches to try.  Ice 2-3 times daily.  Tylenol PRN.  Try pregnancy support belt. --Pt to notify office if symptoms not improved, consider referral for PT. - Elastic Bandages & Supports (COMFORT FIT MATERNITY SUPP MED) MISC; 1 Device by Does not apply route daily.  Dispense: 1 each; Refill: 0  3. Back pain affecting pregnancy in second trimester  - Elastic Bandages & Supports (COMFORT FIT MATERNITY SUPP MED) MISC; 1 Device by Does not apply route daily.  Dispense: 1 each; Refill: 0  Preterm labor symptoms and general obstetric precautions including but not limited to vaginal bleeding, contractions, leaking of fluid and fetal movement were reviewed in detail with the patient. Please refer to After Visit Summary for other counseling recommendations.   No follow-ups on file.  Future Appointments  Date Time Provider [place]  12/19/2018  3:30 PM [place] 1 [facility]-M[facility]US M[facility]-US  01/02/2019  8:45 AM Lajean Manes, CNM [facility]    Fatima Blank, CNM

## 2018-11-21 NOTE — Patient Instructions (Addendum)
Sciatica  Sciatica is pain, numbness, weakness, or tingling along the path of the sciatic nerve. The sciatic nerve starts in the lower back and runs down the back of each leg. The nerve controls the muscles in the lower leg and in the back of the knee. It also provides feeling (sensation) to the back of the thigh, the lower leg, and the sole of the foot. Sciatica is a symptom of another medical condition that pinches or puts pressure on the sciatic nerve. Sciatica most often only affects one side of the body. Sciatica usually goes away on its own or with treatment. In some cases, sciatica may come back (recur). What are the causes? This condition is caused by pressure on the sciatic nerve or pinching of the nerve. This may be the result of:  A disk in between the bones of the spine bulging out too far (herniated disk).  Age-related changes in the spinal disks.  A pain disorder that affects a muscle in the buttock.  Extra bone growth near the sciatic nerve.  A break (fracture) of the pelvis.  Pregnancy.  Tumor. This is rare. What increases the risk? The following factors may make you more likely to develop this condition:  Playing sports that place pressure or stress on the spine.  Having poor strength and flexibility.  A history of back injury or surgery.  Sitting for long periods of time.  Doing activities that involve repetitive bending or lifting.  Obesity. What are the signs or symptoms? Symptoms can vary from mild to very severe, and they may include:  Any of these problems in the lower back, leg, hip, or buttock: ? Mild tingling, numbness, or dull aches. ? Burning sensations. ? Sharp pains.  Numbness in the back of the calf or the sole of the foot.  Leg weakness.  Severe back pain that makes movement difficult. Symptoms may get worse when you cough, sneeze, or laugh, or when you sit or stand for long periods of time. How is this diagnosed? This condition may be  diagnosed based on:  Your symptoms and medical history.  A physical exam.  Blood tests.  Imaging tests, such as: ? X-rays. ? MRI. ? CT scan. How is this treated? In many cases, this condition improves on its own without treatment. However, treatment may include:  Reducing or modifying physical activity.  Exercising and stretching.  Icing and applying heat to the affected area.  Medicines that help to: ? Relieve pain and swelling. ? Relax your muscles.  Injections of medicines that help to relieve pain, irritation, and inflammation around the sciatic nerve (steroids).  Surgery. Follow these instructions at home: Medicines  Take over-the-counter and prescription medicines only as told by your health care provider.  Ask your health care provider if the medicine prescribed to you: ? Requires you to avoid driving or using heavy machinery. ? Can cause constipation. You may need to take these actions to prevent or treat constipation:  Drink enough fluid to keep your urine pale yellow.  Take over-the-counter or prescription medicines.  Eat foods that are high in fiber, such as beans, whole grains, and fresh fruits and vegetables.  Limit foods that are high in fat and processed sugars, such as fried or sweet foods. Managing pain      If directed, put ice on the affected area. ? Put ice in a plastic bag. ? Place a towel between your skin and the bag. ? Leave the ice on for 20 minutes,   2-3 times a day.  If directed, apply heat to the affected area. Use the heat source that your health care provider recommends, such as a moist heat pack or a heating pad. ? Place a towel between your skin and the heat source. ? Leave the heat on for 20-30 minutes. ? Remove the heat if your skin turns bright red. This is especially important if you are unable to feel pain, heat, or cold. You may have a greater risk of getting burned. Activity   Return to your normal activities as told  by your health care provider. Ask your health care provider what activities are safe for you.  Avoid activities that make your symptoms worse.  Take brief periods of rest throughout the day. ? When you rest for longer periods, mix in some mild activity or stretching between periods of rest. This will help to prevent stiffness and pain. ? Avoid sitting for long periods of time without moving. Get up and move around at least one time each hour.  Exercise and stretch regularly, as told by your health care provider.  Do not lift anything that is heavier than 10 lb (4.5 kg) while you have symptoms of sciatica. When you do not have symptoms, you should still avoid heavy lifting, especially repetitive heavy lifting.  When you lift objects, always use proper lifting technique, which includes: ? Bending your knees. ? Keeping the load close to your body. ? Avoiding twisting. General instructions  Maintain a healthy weight. Excess weight puts extra stress on your back.  Wear supportive, comfortable shoes. Avoid wearing high heels.  Avoid sleeping on a mattress that is too soft or too hard. A mattress that is firm enough to support your back when you sleep may help to reduce your pain.  Keep all follow-up visits as told by your health care provider. This is important. Contact a health care provider if:  You have pain that: ? Wakes you up when you are sleeping. ? Gets worse when you lie down. ? Is worse than you have experienced in the past. ? Lasts longer than 4 weeks.  You have an unexplained weight loss. Get help right away if:  You are not able to control when you urinate or have bowel movements (incontinence).  You have: ? Weakness in your lower back, pelvis, buttocks, or legs that gets worse. ? Redness or swelling of your back. ? A burning sensation when you urinate. Summary  Sciatica is pain, numbness, weakness, or tingling along the path of the sciatic nerve.  This condition  is caused by pressure on the sciatic nerve or pinching of the nerve.  Sciatica can cause pain, numbness, or tingling in the lower back, legs, hips, and buttocks.  Treatment often includes rest, exercise, medicines, and applying ice or heat. This information is not intended to replace advice given to you by your health care provider. Make sure you discuss any questions you have with your health care provider. Document Released: 04/20/2001 Document Revised: 05/15/2018 Document Reviewed: 05/15/2018 [employer] Patient Education  2020 [employer] Sciatica Rehab Ask your health care provider which exercises are safe for you. Do exercises exactly as told by your health care provider and adjust them as directed. It is normal to feel mild stretching, pulling, tightness, or discomfort as you do these exercises. Stop right away if you feel sudden pain or your pain gets worse. Do not begin these exercises until told by your health care provider. Stretching and range-of-motion exercises These   exercises warm up your muscles and joints and improve the movement and flexibility of your hips and back. These exercises also help to relieve pain, numbness, and tingling. Sciatic nerve glide 1. Sit in a chair with your head facing down toward your chest. Place your hands behind your back. Let your shoulders slump forward. 2. Slowly straighten one of your legs while you tilt your head back as if you are looking toward the ceiling. Only straighten your leg as far as you can without making your symptoms worse. 3. Hold this position for __________ seconds. 4. Slowly return to the starting position. 5. Repeat with your other leg. Repeat __________ times. Complete this exercise __________ times a day. Knee to chest with hip adduction and internal rotation  1. Lie on your back on a firm surface with both legs straight. 2. Bend one of your knees and move it up toward your chest until you feel a gentle stretch in your lower  back and buttock. Then, move your knee toward the shoulder that is on the opposite side from your leg. This is hip adduction and internal rotation. ? Hold your leg in this position by holding on to the front of your knee. 3. Hold this position for __________ seconds. 4. Slowly return to the starting position. 5. Repeat with your other leg. Repeat __________ times. Complete this exercise __________ times a day. Prone extension on elbows  1. Lie on your abdomen on a firm surface. A bed may be too soft for this exercise. 2. Prop yourself up on your elbows. 3. Use your arms to help lift your chest up until you feel a gentle stretch in your abdomen and your lower back. ? This will place some of your body weight on your elbows. If this is uncomfortable, try stacking pillows under your chest. ? Your hips should stay down, against the surface that you are lying on. Keep your hip and back muscles relaxed. 4. Hold this position for __________ seconds. 5. Slowly relax your upper body and return to the starting position. Repeat __________ times. Complete this exercise __________ times a day. Strengthening exercises These exercises build strength and endurance in your back. Endurance is the ability to use your muscles for a long time, even after they get tired. Pelvic tilt This exercise strengthens the muscles that lie deep in the abdomen. 1. Lie on your back on a firm surface. Bend your knees and keep your feet flat on the floor. 2. Tense your abdominal muscles. Tip your pelvis up toward the ceiling and flatten your lower back into the floor. ? To help with this exercise, you may place a small towel under your lower back and try to push your back into the towel. 3. Hold this position for __________ seconds. 4. Let your muscles relax completely before you repeat this exercise. Repeat __________ times. Complete this exercise __________ times a day. Alternating arm and leg raises  1. Get on your hands  and knees on a firm surface. If you are on a hard floor, you may want to use padding, such as an exercise mat, to cushion your knees. 2. Line up your arms and legs. Your hands should be directly below your shoulders, and your knees should be directly below your hips. 3. Lift your left leg behind you. At the same time, raise your right arm and straighten it in front of you. ? Do not lift your leg higher than your hip. ? Do not lift your arm higher   than your shoulder. ? Keep your abdominal and back muscles tight. ? Keep your hips facing the ground. ? Do not arch your back. ? Keep your balance carefully, and do not hold your breath. 4. Hold this position for __________ seconds. 5. Slowly return to the starting position. 6. Repeat with your right leg and your left arm. Repeat __________ times. Complete this exercise __________ times a day. Posture and body mechanics Good posture and healthy body mechanics can help to relieve stress in your body's tissues and joints. Body mechanics refers to the movements and positions of your body while you do your daily activities. Posture is part of body mechanics. Good posture means:  Your spine is in its natural S-curve position (neutral).  Your shoulders are pulled back slightly.  Your head is not tipped forward. Follow these guidelines to improve your posture and body mechanics in your everyday activities. Standing   When standing, keep your spine neutral and your feet about hip width apart. Keep a slight bend in your knees. Your ears, shoulders, and hips should line up.  When you do a task in which you stand in one place for a long time, place one foot up on a stable object that is 2-4 inches (5-10 cm) high, such as a footstool. This helps keep your spine neutral. Sitting   When sitting, keep your spine neutral and keep your feet flat on the floor. Use a footrest, if necessary, and keep your thighs parallel to the floor. Avoid rounding your  shoulders, and avoid tilting your head forward.  When working at a desk or a computer, keep your desk at a height where your hands are slightly lower than your elbows. Slide your chair under your desk so you are close enough to maintain good posture.  When working at a computer, place your monitor at a height where you are looking straight ahead and you do not have to tilt your head forward or downward to look at the screen. Resting  When lying down and resting, avoid positions that are most painful for you.  If you have pain with activities such as sitting, bending, stooping, or squatting, lie in a position in which your body does not bend very much. For example, avoid curling up on your side with your arms and knees near your chest (fetal position).  If you have pain with activities such as standing for a long time or reaching with your arms, lie with your spine in a neutral position and bend your knees slightly. Try the following positions: ? Lying on your side with a pillow between your knees. ? Lying on your back with a pillow under your knees. Lifting   When lifting objects, keep your feet at least shoulder width apart and tighten your abdominal muscles.  Bend your knees and hips and keep your spine neutral. It is important to lift using the strength of your legs, not your back. Do not lock your knees straight out.  Always ask for help to lift heavy or awkward objects. This information is not intended to replace advice given to you by your health care provider. Make sure you discuss any questions you have with your health care provider. Document Released: 04/26/2005 Document Revised: 08/18/2018 Document Reviewed: 05/18/2018 Elsevier Patient Education  2020 [employer]  

## 2018-11-22 LAB — ALPHA FETOPROTEIN, MATERNAL
AFP MoM: 1.59
AFP, Serum: 105.4 ng/mL
Calc'd Gestational Age: 22 weeks
Maternal Wt: 175 [lb_av]
Risk for ONTD: 1
Twins-AFP: 1

## 2018-12-14 ENCOUNTER — Encounter (HOSPITAL_COMMUNITY): Payer: Self-pay

## 2018-12-19 ENCOUNTER — Ambulatory Visit (HOSPITAL_COMMUNITY): Payer: BC Managed Care – PPO | Admitting: *Deleted

## 2018-12-19 ENCOUNTER — Other Ambulatory Visit: Payer: Self-pay

## 2018-12-19 ENCOUNTER — Ambulatory Visit (HOSPITAL_COMMUNITY)
Admission: RE | Admit: 2018-12-19 | Discharge: 2018-12-19 | Disposition: A | Discharge status: Home / Self Care | Payer: BC Managed Care – PPO | Source: Ambulatory Visit | Attending: Obstetrics and Gynecology | Admitting: Obstetrics and Gynecology

## 2018-12-19 DIAGNOSIS — Z362 Encounter for other antenatal screening follow-up: Secondary | ICD-10-CM

## 2018-12-19 DIAGNOSIS — Z3A26 26 weeks gestation of pregnancy: Secondary | ICD-10-CM

## 2018-12-19 DIAGNOSIS — O43199 Other malformation of placenta, unspecified trimester: Secondary | ICD-10-CM

## 2018-12-19 DIAGNOSIS — Z3480 Encounter for supervision of other normal pregnancy, unspecified trimester: Secondary | ICD-10-CM | POA: Diagnosis not present

## 2018-12-19 DIAGNOSIS — O34219 Maternal care for unspecified type scar from previous cesarean delivery: Secondary | ICD-10-CM | POA: Diagnosis not present

## 2018-12-19 DIAGNOSIS — O43192 Other malformation of placenta, second trimester: Secondary | ICD-10-CM | POA: Diagnosis not present

## 2018-12-20 ENCOUNTER — Other Ambulatory Visit (HOSPITAL_COMMUNITY): Payer: Self-pay | Admitting: *Deleted

## 2018-12-20 DIAGNOSIS — O43193 Other malformation of placenta, third trimester: Secondary | ICD-10-CM

## 2019-01-02 ENCOUNTER — Other Ambulatory Visit: Payer: Self-pay

## 2019-01-02 ENCOUNTER — Ambulatory Visit (INDEPENDENT_AMBULATORY_CARE_PROVIDER_SITE_OTHER): Payer: BC Managed Care – PPO | Admitting: Certified Nurse Midwife

## 2019-01-02 VITALS — BP 100/65 | HR 81 | Wt 187.0 lb

## 2019-01-02 DIAGNOSIS — Z3A28 28 weeks gestation of pregnancy: Secondary | ICD-10-CM

## 2019-01-02 DIAGNOSIS — O34219 Maternal care for unspecified type scar from previous cesarean delivery: Secondary | ICD-10-CM

## 2019-01-02 DIAGNOSIS — O43199 Other malformation of placenta, unspecified trimester: Secondary | ICD-10-CM

## 2019-01-02 DIAGNOSIS — Z3480 Encounter for supervision of other normal pregnancy, unspecified trimester: Secondary | ICD-10-CM

## 2019-01-02 DIAGNOSIS — O43193 Other malformation of placenta, third trimester: Secondary | ICD-10-CM

## 2019-01-02 DIAGNOSIS — Z23 Encounter for immunization: Secondary | ICD-10-CM | POA: Diagnosis not present

## 2019-01-02 NOTE — Progress Notes (Addendum)
   PRENATAL VISIT NOTE  Subjective:  Mary Mcpherson is a 33 y.o. G3P2002 at [redacted]w[redacted]d being seen today for ongoing prenatal care.  She is currently monitored for the following issues for this low-risk pregnancy and has History of cervical dysplasia; History of positive PPD; Shingles; Generalized anxiety disorder; Migraine; Supervision of other normal pregnancy, antepartum; Previous cesarean delivery affecting pregnancy; and Marginal insertion of umbilical cord affecting management of mother on their problem list.  Patient reports no complaints.  Contractions: Not present. Vag. Bleeding: None.  Movement: Present. Denies leaking of fluid.   The following portions of the patient's history were reviewed and updated as appropriate: allergies, current medications, past family history, past medical history, past social history, past surgical history and problem list.   Objective:   Vitals:   01/02/19 0844  BP: 100/65  Pulse: 81  Weight: 187 lb (84.8 kg)    Fetal Status: Fetal Heart Rate (bpm): 156 Fundal Height: 31 cm Movement: Present     General:  Alert, oriented and cooperative. Patient is in no acute distress.  Skin: Skin is warm and dry. No rash noted.   Cardiovascular: Normal heart rate noted  Respiratory: Normal respiratory effort, no problems with respiration noted  Abdomen: Soft, gravid, appropriate for gestational age. Fundal height 32  Pain/Pressure: Absent     Pelvic: Cervical exam deferred        Extremities: Normal range of motion.  Edema: None  Mental Status: Normal mood and affect. Normal behavior. Normal judgment and thought content.   Assessment and Plan:  Pregnancy: G3P2002 at [redacted]w[redacted]d 1. Supervision of other normal pregnancy, antepartum - Routine OB visit - 2 hr GTT and 28 weeks labs - Anticipatory guidance for upcoming visits - COVID19 precautions discussed  - 2Hr GTT w/ 1 Hr Carpenter 75 g - HIV antibody (with reflex) - CBC - RPR  2. Previous cesarean delivery  affecting pregnancy - Patient reports having C/S x2 in 2006 and 2007  - Strongly desires TOLAC, has spoke with Dr Hulan Fray and Dr Donalee Citrin about risks  - Educated and discussed that C/Sx2 increases risk and will discuss with practice to make sure everyone is in agreeance with allowing patient to have TOLAC   3. Marginal insertion of umbilical cord affecting management of mother - F/u US scheduled for 9/10 - continue to monitor fetal growth and FH   Preterm labor symptoms and general obstetric precautions including but not limited to vaginal bleeding, contractions, leaking of fluid and fetal movement were reviewed in detail with the patient. Please refer to After Visit Summary for other counseling recommendations.   Return in about 4 weeks (around 01/30/2019) for ROB-mychart.  Future Appointments  Date Time Provider [place]  01/18/2019  3:45 PM [place] [facility]-US  02/05/2019 10:45 AM Guss Bunde, MD [place], CNM 01/02/19, 1:14 PM

## 2019-01-03 LAB — CBC
HCT: 34.7 % — ABNORMAL LOW (ref 35.0–45.0)
Hemoglobin: 11.8 g/dL (ref 11.7–15.5)
MCH: 32.3 pg (ref 27.0–33.0)
MCHC: 34 g/dL (ref 32.0–36.0)
MCV: 95.1 fL (ref 80.0–100.0)
MPV: 10.3 fL (ref 7.5–12.5)
Platelets: 263 10*3/uL (ref 140–400)
RBC: 3.65 10*6/uL — ABNORMAL LOW (ref 3.80–5.10)
RDW: 12.6 % (ref 11.0–15.0)
WBC: 9.6 10*3/uL (ref 3.8–10.8)

## 2019-01-03 LAB — 2HR GTT W 1 HR, CARPENTER, 75 G
Glucose, 1 Hr, Gest: 111 mg/dL (ref 65–179)
Glucose, 2 Hr, Gest: 88 mg/dL (ref 65–152)
Glucose, Fasting, Gest: 61 mg/dL — ABNORMAL LOW (ref 65–91)

## 2019-01-03 LAB — RPR: RPR Ser Ql: NONREACTIVE

## 2019-01-03 LAB — HIV ANTIBODY (ROUTINE TESTING W REFLEX): HIV 1&2 Ab, 4th Generation: NONREACTIVE

## 2019-01-18 ENCOUNTER — Ambulatory Visit (HOSPITAL_COMMUNITY)
Admission: RE | Admit: 2019-01-18 | Discharge: 2019-01-18 | Disposition: A | Discharge status: Home / Self Care | Payer: BC Managed Care – PPO | Source: Ambulatory Visit | Attending: Obstetrics and Gynecology | Admitting: Obstetrics and Gynecology

## 2019-01-18 ENCOUNTER — Other Ambulatory Visit: Payer: Self-pay

## 2019-01-18 DIAGNOSIS — O34219 Maternal care for unspecified type scar from previous cesarean delivery: Secondary | ICD-10-CM | POA: Diagnosis not present

## 2019-01-18 DIAGNOSIS — Z362 Encounter for other antenatal screening follow-up: Secondary | ICD-10-CM | POA: Diagnosis not present

## 2019-01-18 DIAGNOSIS — O43193 Other malformation of placenta, third trimester: Secondary | ICD-10-CM | POA: Insufficient documentation

## 2019-01-18 DIAGNOSIS — Z3A30 30 weeks gestation of pregnancy: Secondary | ICD-10-CM

## 2019-01-19 ENCOUNTER — Other Ambulatory Visit (HOSPITAL_COMMUNITY): Payer: Self-pay | Admitting: *Deleted

## 2019-01-19 DIAGNOSIS — O43193 Other malformation of placenta, third trimester: Secondary | ICD-10-CM

## 2019-01-29 ENCOUNTER — Other Ambulatory Visit: Payer: Self-pay | Admitting: *Deleted

## 2019-01-29 MED ORDER — BREAST PUMP MISC: 1.0000 | 0 refills | Status: DC | PRN

## 2019-01-29 NOTE — Progress Notes (Signed)
Pt called requesting a RX for breast pump.

## 2019-02-05 ENCOUNTER — Encounter: Payer: Self-pay | Admitting: Obstetrics & Gynecology

## 2019-02-05 ENCOUNTER — Telehealth (INDEPENDENT_AMBULATORY_CARE_PROVIDER_SITE_OTHER): Payer: BC Managed Care – PPO | Admitting: Obstetrics & Gynecology

## 2019-02-05 DIAGNOSIS — Z3480 Encounter for supervision of other normal pregnancy, unspecified trimester: Secondary | ICD-10-CM

## 2019-02-05 DIAGNOSIS — O43199 Other malformation of placenta, unspecified trimester: Secondary | ICD-10-CM

## 2019-02-05 DIAGNOSIS — O34219 Maternal care for unspecified type scar from previous cesarean delivery: Secondary | ICD-10-CM

## 2019-02-05 DIAGNOSIS — O43193 Other malformation of placenta, third trimester: Secondary | ICD-10-CM

## 2019-02-05 DIAGNOSIS — Z3A32 32 weeks gestation of pregnancy: Secondary | ICD-10-CM

## 2019-02-05 MED ORDER — LANSOPRAZOLE 30 MG PO CPDR: 30.0000 mg | DELAYED_RELEASE_CAPSULE | Freq: Two times a day (BID) | ORAL | 1 refills | Status: DC

## 2019-02-05 NOTE — Progress Notes (Signed)
[place] VIRTUAL VIDEO VISIT ENCOUNTER NOTE  Provider location: [facility] at [place]   I connected with Mary Mcpherson on 02/05/19 at 10:45 AM EDT by MyChart Video Encounter at home and verified that I am speaking with the correct person using two identifiers.   I discussed the limitations, risks, security and privacy concerns of performing an evaluation and management service virtually and the availability of in person appointments. I also discussed with the patient that there may be a patient responsible charge related to this service. The patient expressed understanding and agreed to proceed. Subjective:  Mary Mcpherson is a 33 y.o. G3P2002 at [redacted]w[redacted]d being seen today for ongoing prenatal care.  She is currently monitored for the following issues for this high-risk pregnancy and has History of cervical dysplasia; History of positive PPD; Shingles; Generalized anxiety disorder; Migraine; Supervision of other normal pregnancy, antepartum; Previous cesarean delivery affecting pregnancy; and Marginal insertion of umbilical cord affecting management of mother on their problem list.  Patient reports refluxGERD not responding to prilosec.  Contractions: Not present. Vag. Bleeding: None.  Movement: Present. Denies any leaking of fluid. Pt having an egg white discharge without odor or itching.   The following portions of the patient's history were reviewed and updated as appropriate: allergies, current medications, past family history, past medical history, past social history, past surgical history and problem list.   Objective:   Vitals:   02/05/19 0934  BP: 97/70  Weight: 189 lb (85.7 kg)    Fetal Status:     Movement: Present     General:  Alert, oriented and cooperative. Patient is in no acute distress.  Respiratory: Normal respiratory effort, no problems with respiration noted  Mental Status: Normal mood and affect. Normal behavior. Normal  judgment and thought content.  Rest of physical exam deferred due to type of encounter  Imaging: [place] Mfm Ob Follow Up  Result Date: 01/20/2019 ----------------------------------------------------------------------  OBSTETRICS REPORT                        (Signed Final 01/20/2019 06:55 am) ---------------------------------------------------------------------- Patient Info  ID #:       [id number]                          D.O.B.:  [birth year] (33 yrs)  Name:       Mary Mcpherson                   Visit Date: 01/18/2019 03:58 pm ---------------------------------------------------------------------- Performed By  Performed By:     Valda Favia          Ref. Address:      [address]  [place], [place]                                                              [address]  Attending:        Lin Landsman      [facility]                                        [facility]  Referred By:      Allie Bossier MD ---------------------------------------------------------------------- Orders   #  Description                          Code         Ordered By   1  [place] MFM OB FOLLOW UP                  60454.09     Noralee Space  ----------------------------------------------------------------------   #  Order #                    Accession #                 Episode #   1  [id number]                  [id number]                  [id number]  ---------------------------------------------------------------------- Indications   [redacted] weeks gestation of pregnancy                Z3A.30   Marginal insertion of umbilical cord affecting O43.192   management of mother in second trimester   Previous cesarean delivery x 2                 O34.219   Encounter for antenatal screening for          Z36.3   malformations (NIPS, low risk female)   ---------------------------------------------------------------------- Vital Signs                                                 Height:        5'7" ---------------------------------------------------------------------- Fetal Evaluation  Num Of Fetuses:          1  Cardiac Activity:        Observed  Placenta:                Anterior  P. Cord Insertion:       Marginal insertion  Amniotic Fluid  AFI FV:      Within normal limits  AFI Sum(cm)     %Tile       Largest Pocket(cm)  15.43           55          6.33  RUQ(cm)  RLQ(cm)       LUQ(cm)        LLQ(cm)  4.1           0             5              6.33 ---------------------------------------------------------------------- Biometry  BPD:      75.7  mm     G. Age:  30w 3d         40  %    CI:        70.93   %    70 - 86                                                          FL/HC:       20.3  %    19.2 - 21.4  HC:      286.4  mm     G. Age:  31w 3d         48  %    HC/AC:       0.96       0.99 - 1.21  AC:      299.3  mm     G. Age:  33w 6d       > 99  %    FL/BPD:      76.8  %    71 - 87  FL:       58.1  mm     G. Age:  30w 3d         38  %    FL/AC:       19.4  %    20 - 24  HUM:      55.2  mm     G. Age:  32w 1d         85  %  Est. FW:    1950   gm     4 lb 5 oz     95  % ---------------------------------------------------------------------- OB History  Gravidity:    3         Term:   2        Prem:   0        SAB:   0  TOP:          0       Ectopic:  0        Living: 2 ---------------------------------------------------------------------- Gestational Age  LMP:           30w 2d        Date:  06/20/18                 EDD:   03/27/19  U/S Today:     31w 4d                                        EDD:   03/18/19  Best:          30w 2d     Det. By:  LMP  (06/20/18)          EDD:   03/27/19 ---------------------------------------------------------------------- Anatomy  Cranium:  Appears normal         LVOT:                   Previously seen  Cavum:                  Previously seen        Aortic Arch:            Previously seen  Ventricles:            Appears normal         Ductal Arch:            Previously seen  Choroid Plexus:        Previously seen        Diaphragm:              Previously seen  Cerebellum:            Previously seen        Stomach:                Appears normal, left                                                                        sided  Posterior Fossa:       Previously seen        Abdomen:                Previously seen  Nuchal Fold:           Not applicable (>20    Abdominal Wall:         Previously seen                         wks GA)  Face:                  Orbits/profile         Cord Vessels:           Previously seen                         previously seen  Lips:                  Previously seen        Kidneys:                Appear normal  Palate:                Previously seen        Bladder:                Appears normal  Thoracic:              Appears normal         Spine:                  Previously seen  Heart:                 Previously seen        Upper Extremities:      Previously seen  RVOT:  Previously seen        Lower Extremities:      Previously seen  Other:  Fetus appears to be female. Nasal bone visualized previously,  Heels          and 5th digit previously visualized. ---------------------------------------------------------------------- Cervix Uterus Adnexa  Cervix  Not visualized (advanced GA >24wks)  Uterus  No abnormality visualized.  Left Ovary  No adnexal mass visualized.  Right Ovary  No adnexal mass visualized.  Cul De Sac  No free fluid seen.  Adnexa  No abnormality visualized. ---------------------------------------------------------------------- Impression  Normal interval growth.  Marginal cord insertion  Anatomy cleared  Normal fetal movement and amniotic fluid ---------------------------------------------------------------------- Recommendations  Follow up growth in 4 weeks.  ----------------------------------------------------------------------               Lin Landsman, MD Electronically Signed Final Report   01/20/2019 06:55 am ----------------------------------------------------------------------   Assessment and Plan:  Pregnancy: [id number] at [redacted]w[redacted]d 1. Marginal insertion of umbilical cord affecting management of mother -MFM following growths  2. Previous cesarean delivery affecting pregnancy -First baby--8 pounds 13 ounces; pushed 4 hours and had c/s for failure and dsitress -Second baby--rpt c/s -Wants VBAC; pt will be flexible with baby's weight and understands no inductions.   3. Reflux Switch to prevacid bid.    Preterm labor symptoms and general obstetric precautions including but not limited to vaginal bleeding, contractions, leaking of fluid and fetal movement were reviewed in detail with the patient. I discussed the assessment and treatment plan with the patient. The patient was provided an opportunity to ask questions and all were answered. The patient agreed with the plan and demonstrated an understanding of the instructions. The patient was advised to call back or seek an in-person office evaluation/go to [facility]U at [facility] for any urgent or concerning symptoms. Please refer to After Visit Summary for other counseling recommendations.   I provided 20 minutes of face-to-face time during this encounter.  No follow-ups on file.  Future Appointments  Date Time Provider Department Center  02/05/2019 10:45 AM Lesly Dukes, MD [facility]  02/15/2019  4:00 PM [facility] [place] 3 [facility]    Elsie Lincoln, MD [facility]

## 2019-02-07 ENCOUNTER — Other Ambulatory Visit: Payer: Self-pay

## 2019-02-07 DIAGNOSIS — Z3480 Encounter for supervision of other normal pregnancy, unspecified trimester: Secondary | ICD-10-CM

## 2019-02-07 MED ORDER — BREAST PUMP MISC: 1.0000 | 0 refills | Status: DC | PRN

## 2019-02-15 ENCOUNTER — Other Ambulatory Visit: Payer: Self-pay

## 2019-02-15 ENCOUNTER — Ambulatory Visit (HOSPITAL_COMMUNITY)
Admission: RE | Admit: 2019-02-15 | Discharge: 2019-02-15 | Disposition: A | Discharge status: Home / Self Care | Payer: BC Managed Care – PPO | Source: Ambulatory Visit | Attending: Maternal & Fetal Medicine | Admitting: Maternal & Fetal Medicine

## 2019-02-15 DIAGNOSIS — Z3A34 34 weeks gestation of pregnancy: Secondary | ICD-10-CM

## 2019-02-15 DIAGNOSIS — O34219 Maternal care for unspecified type scar from previous cesarean delivery: Secondary | ICD-10-CM | POA: Diagnosis not present

## 2019-02-15 DIAGNOSIS — Z362 Encounter for other antenatal screening follow-up: Secondary | ICD-10-CM

## 2019-02-15 DIAGNOSIS — O43193 Other malformation of placenta, third trimester: Secondary | ICD-10-CM | POA: Insufficient documentation

## 2019-02-28 ENCOUNTER — Ambulatory Visit (INDEPENDENT_AMBULATORY_CARE_PROVIDER_SITE_OTHER): Payer: BC Managed Care – PPO | Admitting: Obstetrics & Gynecology

## 2019-02-28 ENCOUNTER — Other Ambulatory Visit: Payer: Self-pay

## 2019-02-28 ENCOUNTER — Other Ambulatory Visit: Payer: Self-pay | Admitting: Obstetrics & Gynecology

## 2019-02-28 VITALS — BP 114/73 | HR 87 | Wt 194.0 lb

## 2019-02-28 DIAGNOSIS — Z3480 Encounter for supervision of other normal pregnancy, unspecified trimester: Secondary | ICD-10-CM | POA: Diagnosis not present

## 2019-02-28 DIAGNOSIS — Z3483 Encounter for supervision of other normal pregnancy, third trimester: Secondary | ICD-10-CM

## 2019-02-28 DIAGNOSIS — Z113 Encounter for screening for infections with a predominantly sexual mode of transmission: Secondary | ICD-10-CM

## 2019-02-28 DIAGNOSIS — Z3A36 36 weeks gestation of pregnancy: Secondary | ICD-10-CM

## 2019-02-28 NOTE — Progress Notes (Signed)
   PRENATAL VISIT NOTE  Subjective:  Mary Mcpherson is a 33 y.o. G3P2002 at [redacted]w[redacted]d being seen today for ongoing prenatal care.  She is currently monitored for the following issues for this low-risk pregnancy and has History of cervical dysplasia; History of positive PPD; Shingles; Generalized anxiety disorder; Migraine; Supervision of other normal pregnancy, antepartum; Previous cesarean delivery affecting pregnancy; and Marginal insertion of umbilical cord affecting management of mother on their problem list.  Patient reports no complaints.  Contractions: Not present. Vag. Bleeding: None.  Movement: Present. Denies leaking of fluid.   The following portions of the patient's history were reviewed and updated as appropriate: allergies, current medications, past family history, past medical history, past social history, past surgical history and problem list.   Objective:   Vitals:   02/28/19 1028  BP: 114/73  Pulse: 87  Weight: 194 lb (88 kg)    Fetal Status: Fetal Heart Rate (bpm): 147   Movement: Present     General:  Alert, oriented and cooperative. Patient is in no acute distress.  Skin: Skin is warm and dry. No rash noted.   Cardiovascular: Normal heart rate noted  Respiratory: Normal respiratory effort, no problems with respiration noted  Abdomen: Soft, gravid, appropriate for gestational age.  Pain/Pressure: Absent     Pelvic: Cervical exam performed        Extremities: Normal range of motion.  Edema: Trace  Mental Status: Normal mood and affect. Normal behavior. Normal judgment and thought content.   Assessment and Plan:  Pregnancy: G3P2002 at [redacted]w[redacted]d 1. Supervision of other normal pregnancy, antepartum  - Culture, beta strep (group b only) - Cervicovaginal ancillary only( [facility]) 2. H/o 2 cesarean sections- she would like TOLAC, consent signed today   Preterm labor symptoms and general obstetric precautions including but not limited to vaginal bleeding, contractions,  leaking of fluid and fetal movement were reviewed in detail with the patient. Please refer to After Visit Summary for other counseling recommendations.   No follow-ups on file.  No future appointments.  Emily Filbert, MD

## 2019-03-02 LAB — CERVICOVAGINAL ANCILLARY ONLY
Chlamydia: NEGATIVE
Comment: NEGATIVE
Comment: NORMAL
Neisseria Gonorrhea: NEGATIVE

## 2019-03-03 LAB — CULTURE, BETA STREP (GROUP B ONLY)
MICRO NUMBER:: 1014040
SPECIMEN QUALITY:: ADEQUATE

## 2019-03-06 ENCOUNTER — Telehealth (INDEPENDENT_AMBULATORY_CARE_PROVIDER_SITE_OTHER): Payer: BC Managed Care – PPO | Admitting: Obstetrics and Gynecology

## 2019-03-06 DIAGNOSIS — Z3480 Encounter for supervision of other normal pregnancy, unspecified trimester: Secondary | ICD-10-CM

## 2019-03-06 DIAGNOSIS — O43199 Other malformation of placenta, unspecified trimester: Secondary | ICD-10-CM

## 2019-03-06 DIAGNOSIS — O43193 Other malformation of placenta, third trimester: Secondary | ICD-10-CM

## 2019-03-06 DIAGNOSIS — O34219 Maternal care for unspecified type scar from previous cesarean delivery: Secondary | ICD-10-CM

## 2019-03-06 DIAGNOSIS — Z3A37 37 weeks gestation of pregnancy: Secondary | ICD-10-CM

## 2019-03-06 NOTE — Progress Notes (Signed)
Pt states she is still feeling baby move but, not as much as before

## 2019-03-06 NOTE — Progress Notes (Signed)
   TELEHEALTH VIRTUAL OBSTETRICS VISIT ENCOUNTER NOTE  I connected with Mary Mcpherson on 03/06/19 at 10:45 AM EDT by telephone at home and verified that I am speaking with the correct person using two identifiers.   I discussed the limitations, risks, security and privacy concerns of performing an evaluation and management service by telephone and the availability of in person appointments. I also discussed with the patient that there may be a patient responsible charge related to this service. The patient expressed understanding and agreed to proceed.  Subjective:  Mary Mcpherson is a 33 y.o. G3P2002 at [redacted]w[redacted]d being followed for ongoing prenatal care.  She is currently monitored for the following issues for this low-risk pregnancy and has History of cervical dysplasia; History of positive PPD; Shingles; Generalized anxiety disorder; Migraine; Supervision of other normal pregnancy, antepartum; Previous cesarean delivery affecting pregnancy; and Marginal insertion of umbilical cord affecting management of mother on their problem list.  Patient reports Decreaesd fetal movement. Reports fetal movement. Denies any contractions, bleeding or leaking of fluid.   The following portions of the patient's history were reviewed and updated as appropriate: allergies, current medications, past family history, past medical history, past social history, past surgical history and problem list.   Objective:   General:  Alert, oriented and cooperative.   Mental Status: Normal mood and affect perceived. Normal judgment and thought content.  Rest of physical exam deferred due to type of encounter  Assessment and Plan:  Pregnancy: G3P2002 at [redacted]w[redacted]d 1. Marginal insertion of umbilical cord affecting management of mother  ? Resolved, [place] for growth ultrasound.  LGA  2. Previous cesarean delivery affecting pregnancy  2 prior C-sections, Dr.Dove ok with patient having a vaginal delivery; VBAC   3. Supervision of  other normal pregnancy, antepartum  BP today 108/70 Decreased fetal movement: She is not concerned about baby movements. Babies schedule of movements are normal, however she is moving less than she did in the 2nd trimester. Mary Mcpherson will call the office or go to MAU if anything changes with movements.   In person visit next visit in 1 week.   Term labor symptoms and general obstetric precautions including but not limited to vaginal bleeding, contractions, leaking of fluid and fetal movement were reviewed in detail with the patient.  I discussed the assessment and treatment plan with the patient. The patient was provided an opportunity to ask questions and all were answered. The patient agreed with the plan and demonstrated an understanding of the instructions. The patient was advised to call back or seek an in-person office evaluation/go to MAU at [facility] for any urgent or concerning symptoms. Please refer to After Visit Summary for other counseling recommendations.   I provided 13 minutes of non-face-to-face time during this encounter.  Return in about 1 week (around 03/13/2019), or In person visit in 1 week..  No future appointments.  Noni Saupe, NP [facility], [place]

## 2019-03-09 ENCOUNTER — Other Ambulatory Visit: Payer: Self-pay

## 2019-03-09 ENCOUNTER — Encounter (HOSPITAL_COMMUNITY): Payer: Self-pay | Admitting: Anesthesiology

## 2019-03-09 ENCOUNTER — Encounter (HOSPITAL_COMMUNITY): Payer: Self-pay

## 2019-03-09 ENCOUNTER — Inpatient Hospital Stay (HOSPITAL_COMMUNITY)
Admission: AD | Admit: 2019-03-09 | Discharge: 2019-03-10 | DRG: 806 | Disposition: A | Discharge status: Home / Self Care | Payer: BC Managed Care – PPO | Attending: Obstetrics and Gynecology | Admitting: Obstetrics and Gynecology

## 2019-03-09 DIAGNOSIS — O43123 Velamentous insertion of umbilical cord, third trimester: Secondary | ICD-10-CM | POA: Diagnosis not present

## 2019-03-09 DIAGNOSIS — F419 Anxiety disorder, unspecified: Secondary | ICD-10-CM | POA: Diagnosis not present

## 2019-03-09 DIAGNOSIS — Z3A37 37 weeks gestation of pregnancy: Secondary | ICD-10-CM | POA: Diagnosis not present

## 2019-03-09 DIAGNOSIS — Z20828 Contact with and (suspected) exposure to other viral communicable diseases: Secondary | ICD-10-CM | POA: Diagnosis not present

## 2019-03-09 DIAGNOSIS — O2243 Hemorrhoids in pregnancy, third trimester: Secondary | ICD-10-CM | POA: Diagnosis present

## 2019-03-09 DIAGNOSIS — O3663X0 Maternal care for excessive fetal growth, third trimester, not applicable or unspecified: Secondary | ICD-10-CM | POA: Diagnosis present

## 2019-03-09 DIAGNOSIS — Z3480 Encounter for supervision of other normal pregnancy, unspecified trimester: Secondary | ICD-10-CM

## 2019-03-09 DIAGNOSIS — O43199 Other malformation of placenta, unspecified trimester: Secondary | ICD-10-CM

## 2019-03-09 DIAGNOSIS — O4202 Full-term premature rupture of membranes, onset of labor within 24 hours of rupture: Secondary | ICD-10-CM | POA: Diagnosis not present

## 2019-03-09 DIAGNOSIS — O99344 Other mental disorders complicating childbirth: Secondary | ICD-10-CM | POA: Diagnosis present

## 2019-03-09 DIAGNOSIS — O0993 Supervision of high risk pregnancy, unspecified, third trimester: Secondary | ICD-10-CM

## 2019-03-09 DIAGNOSIS — F329 Major depressive disorder, single episode, unspecified: Secondary | ICD-10-CM | POA: Diagnosis not present

## 2019-03-09 DIAGNOSIS — Z87410 Personal history of cervical dysplasia: Secondary | ICD-10-CM | POA: Diagnosis not present

## 2019-03-09 DIAGNOSIS — O4292 Full-term premature rupture of membranes, unspecified as to length of time between rupture and onset of labor: Principal | ICD-10-CM | POA: Diagnosis present

## 2019-03-09 DIAGNOSIS — O34219 Maternal care for unspecified type scar from previous cesarean delivery: Secondary | ICD-10-CM | POA: Diagnosis not present

## 2019-03-09 LAB — TYPE AND SCREEN
ABO/RH(D): O POS
Antibody Screen: NEGATIVE

## 2019-03-09 LAB — POCT FERN TEST: POCT Fern Test: POSITIVE

## 2019-03-09 LAB — CBC
HCT: 35.4 % — ABNORMAL LOW (ref 36.0–46.0)
Hemoglobin: 11.8 g/dL — ABNORMAL LOW (ref 12.0–15.0)
MCH: 31.8 pg (ref 26.0–34.0)
MCHC: 33.3 g/dL (ref 30.0–36.0)
MCV: 95.4 fL (ref 80.0–100.0)
Platelets: 211 10*3/uL (ref 150–400)
RBC: 3.71 MIL/uL — ABNORMAL LOW (ref 3.87–5.11)
RDW: 13.1 % (ref 11.5–15.5)
WBC: 9.6 10*3/uL (ref 4.0–10.5)
nRBC: 0 % (ref 0.0–0.2)

## 2019-03-09 LAB — SARS CORONAVIRUS 2 BY RT PCR (HOSPITAL ORDER, PERFORMED IN ~~LOC~~ HOSPITAL LAB): SARS Coronavirus 2: NEGATIVE

## 2019-03-09 LAB — ABO/RH: ABO/RH(D): O POS

## 2019-03-09 MED ORDER — OXYCODONE-ACETAMINOPHEN 5-325 MG PO TABS: 1.0000 | ORAL_TABLET | ORAL | Status: DC | PRN

## 2019-03-09 MED ORDER — NALBUPHINE HCL 10 MG/ML IJ SOLN: 5.0000 mg | Freq: Once | INTRAMUSCULAR | Status: DC | PRN

## 2019-03-09 MED ORDER — DIPHENHYDRAMINE HCL 25 MG PO CAPS: 25.0000 mg | ORAL_CAPSULE | ORAL | Status: DC | PRN

## 2019-03-09 MED ORDER — SENNOSIDES-DOCUSATE SODIUM 8.6-50 MG PO TABS
2.0000 | ORAL_TABLET | ORAL | Status: DC
  Administered 2019-03-09: 2 via ORAL
  Filled 2019-03-09: qty 2

## 2019-03-09 MED ORDER — EPHEDRINE 5 MG/ML INJ: 10.0000 mg | INTRAVENOUS | Status: DC | PRN

## 2019-03-09 MED ORDER — OXYTOCIN BOLUS FROM INFUSION
500.0000 mL | Freq: Once | INTRAVENOUS | Status: AC
  Administered 2019-03-09: 500 mL via INTRAVENOUS

## 2019-03-09 MED ORDER — SCOPOLAMINE 1 MG/3DAYS TD PT72: 1.0000 | MEDICATED_PATCH | Freq: Once | TRANSDERMAL | Status: DC

## 2019-03-09 MED ORDER — SIMETHICONE 80 MG PO CHEW: 80.0000 mg | CHEWABLE_TABLET | ORAL | Status: DC | PRN

## 2019-03-09 MED ORDER — LIDOCAINE HCL (PF) 1 % IJ SOLN
30.0000 mL | INTRAMUSCULAR | Status: AC | PRN
  Administered 2019-03-09: 30 mL via SUBCUTANEOUS
  Filled 2019-03-09: qty 30

## 2019-03-09 MED ORDER — NALOXONE HCL 0.4 MG/ML IJ SOLN: 0.4000 mg | INTRAMUSCULAR | Status: DC | PRN

## 2019-03-09 MED ORDER — ONDANSETRON HCL 4 MG PO TABS: 4.0000 mg | ORAL_TABLET | ORAL | Status: DC | PRN

## 2019-03-09 MED ORDER — NALBUPHINE HCL 10 MG/ML IJ SOLN: 5.0000 mg | INTRAMUSCULAR | Status: DC | PRN

## 2019-03-09 MED ORDER — ONDANSETRON HCL 4 MG/2ML IJ SOLN: 4.0000 mg | INTRAMUSCULAR | Status: DC | PRN

## 2019-03-09 MED ORDER — PRENATAL MULTIVITAMIN CH
1.0000 | ORAL_TABLET | Freq: Every day | ORAL | Status: DC
  Administered 2019-03-10: 1 via ORAL
  Filled 2019-03-09: qty 1

## 2019-03-09 MED ORDER — OXYCODONE-ACETAMINOPHEN 5-325 MG PO TABS: 2.0000 | ORAL_TABLET | ORAL | Status: DC | PRN

## 2019-03-09 MED ORDER — LACTATED RINGERS IV SOLN: 500.0000 mL | Freq: Once | INTRAVENOUS | Status: DC

## 2019-03-09 MED ORDER — ONDANSETRON HCL 4 MG/2ML IJ SOLN: 4.0000 mg | Freq: Four times a day (QID) | INTRAMUSCULAR | Status: DC | PRN

## 2019-03-09 MED ORDER — WITCH HAZEL-GLYCERIN EX PADS: 1.0000 "application " | MEDICATED_PAD | CUTANEOUS | Status: DC | PRN

## 2019-03-09 MED ORDER — SOD CITRATE-CITRIC ACID 500-334 MG/5ML PO SOLN: 30.0000 mL | ORAL | Status: DC | PRN

## 2019-03-09 MED ORDER — ACETAMINOPHEN 325 MG PO TABS: 650.0000 mg | ORAL_TABLET | ORAL | Status: DC | PRN

## 2019-03-09 MED ORDER — ONDANSETRON HCL 4 MG/2ML IJ SOLN: 4.0000 mg | Freq: Three times a day (TID) | INTRAMUSCULAR | Status: DC | PRN

## 2019-03-09 MED ORDER — LACTATED RINGERS IV SOLN
INTRAVENOUS | Status: DC
  Administered 2019-03-09: 16:00:00 via INTRAVENOUS

## 2019-03-09 MED ORDER — PHENYLEPHRINE 40 MCG/ML (10ML) SYRINGE FOR IV PUSH (FOR BLOOD PRESSURE SUPPORT): 80.0000 ug | PREFILLED_SYRINGE | INTRAVENOUS | Status: DC | PRN

## 2019-03-09 MED ORDER — OXYCODONE HCL 5 MG PO TABS: 5.0000 mg | ORAL_TABLET | ORAL | Status: DC | PRN

## 2019-03-09 MED ORDER — FENTANYL CITRATE (PF) 100 MCG/2ML IJ SOLN
INTRAMUSCULAR | Status: AC
  Administered 2019-03-09: 100 ug
  Filled 2019-03-09: qty 2

## 2019-03-09 MED ORDER — BENZOCAINE-MENTHOL 20-0.5 % EX AERO
1.0000 "application " | INHALATION_SPRAY | CUTANEOUS | Status: DC | PRN
  Administered 2019-03-09: 1 via TOPICAL
  Filled 2019-03-09 (×2): qty 56

## 2019-03-09 MED ORDER — OXYTOCIN 40 UNITS IN NORMAL SALINE INFUSION - SIMPLE MED
2.5000 [IU]/h | INTRAVENOUS | Status: DC
  Filled 2019-03-09: qty 1000

## 2019-03-09 MED ORDER — ZOLPIDEM TARTRATE 5 MG PO TABS: 5.0000 mg | ORAL_TABLET | Freq: Every evening | ORAL | Status: DC | PRN

## 2019-03-09 MED ORDER — DIPHENHYDRAMINE HCL 50 MG/ML IJ SOLN: 12.5000 mg | INTRAMUSCULAR | Status: DC | PRN

## 2019-03-09 MED ORDER — DIBUCAINE (PERIANAL) 1 % EX OINT: 1.0000 "application " | TOPICAL_OINTMENT | CUTANEOUS | Status: DC | PRN

## 2019-03-09 MED ORDER — SODIUM CHLORIDE 0.9% FLUSH: 3.0000 mL | INTRAVENOUS | Status: DC | PRN

## 2019-03-09 MED ORDER — TETANUS-DIPHTH-ACELL PERTUSSIS 5-2.5-18.5 LF-MCG/0.5 IM SUSP: 0.5000 mL | Freq: Once | INTRAMUSCULAR | Status: DC

## 2019-03-09 MED ORDER — ACETAMINOPHEN 325 MG PO TABS
650.0000 mg | ORAL_TABLET | ORAL | Status: DC | PRN
  Administered 2019-03-09 – 2019-03-10 (×2): 650 mg via ORAL
  Filled 2019-03-09 (×2): qty 2

## 2019-03-09 MED ORDER — IBUPROFEN 600 MG PO TABS
600.0000 mg | ORAL_TABLET | Freq: Four times a day (QID) | ORAL | Status: DC
  Administered 2019-03-09 – 2019-03-10 (×4): 600 mg via ORAL
  Filled 2019-03-09 (×4): qty 1

## 2019-03-09 MED ORDER — LACTATED RINGERS IV SOLN
500.0000 mL | INTRAVENOUS | Status: DC | PRN
  Administered 2019-03-09: 1000 mL via INTRAVENOUS

## 2019-03-09 MED ORDER — FENTANYL CITRATE (PF) 100 MCG/2ML IJ SOLN: 100.0000 ug | Freq: Once | INTRAMUSCULAR | Status: DC

## 2019-03-09 MED ORDER — COCONUT OIL OIL: 1.0000 "application " | TOPICAL_OIL | Status: DC | PRN

## 2019-03-09 MED ORDER — DIPHENHYDRAMINE HCL 25 MG PO CAPS: 25.0000 mg | ORAL_CAPSULE | Freq: Four times a day (QID) | ORAL | Status: DC | PRN

## 2019-03-09 MED ORDER — NALOXONE HCL 4 MG/10ML IJ SOLN
1.0000 ug/kg/h | INTRAVENOUS | Status: DC | PRN
  Filled 2019-03-09: qty 5

## 2019-03-09 NOTE — Discharge Summary (Signed)
Postpartum Discharge Summary    Patient Name: Mary Mcpherson DOB: November 19, 1985 MRN: [id number]  Date of admission: 03/09/2019 Delivering Provider: Ulla Gallo B   Date of discharge: 03/10/2019  Admitting diagnosis: PREG Intrauterine pregnancy: [redacted]w[redacted]d    Secondary diagnosis:  Active Problems:   Supervision of high risk pregnancy in third trimester  Additional problems: H/o CS x2     Discharge diagnosis: Term Pregnancy Delivered and VBAC                                  Post partum procedures: Repair of bilateral labial and 2nd degree perineal lacerations.   Augmentation: None  Complications: None  Hospital course:  Onset of Labor With Vaginal Delivery     33y.o. yo [id number] 34w3das admitted in Latent Labor on 03/09/2019. Patient had an uncomplicated labor course as follows:   She was admitted in latent labor after SROM and presented dilated at 1.5 cm. She then progressed to complete and delivered shortly after.  Membrane Rupture Time/Date: 8:00 AM ,03/09/2019   Intrapartum Procedures: Episiotomy: None [1]                                         Lacerations:  Labial [10]  Patient had a delivery of a Viable infant. 03/09/2019  Information for the patient's newborn:  InLurene, Robleyirl JeElizette0[[id number]]Delivery Method: VBAC, Spontaneous(Filed from Delivery Summary)    Delivery time: 6:42 PM  Patient had an uncomplicated postpartum course.  She is ambulating, tolerating a regular diet, passing flatus, and urinating well. Patient is discharged home in stable condition on 03/10/19.  Magnesium Sulfate received: No BMZ received: No Rhophylac:N/A MMR:N/A Transfusion:No  Physical exam  Vitals:   03/10/19 0216 03/10/19 0546 03/10/19 0940 03/10/19 1550  BP: 98/63 96/63 107/72 109/71  Pulse: 67 66 74 63  Resp: '18 18 18 18  ' Temp: 98.6 F (37 C) 98.6 F (37 C) 98.5 F (36.9 C) 98.7 F (37.1 C)  TempSrc: Axillary Oral Oral Axillary  SpO2:      Weight:      Height:        General: alert Lochia: appropriate Uterine Fundus: firm Incision: N/A DVT Evaluation: No evidence of DVT seen on physical exam. Labs: Lab Results  Component Value Date   WBC 12.4 (H) 03/10/2019   HGB 9.8 (L) 03/10/2019   HCT 28.9 (L) 03/10/2019   MCV 92.6 03/10/2019   PLT 225 03/10/2019   CMP Latest Ref Rng & Units 07/03/2018  Glucose 65 - 99 mg/dL 87  BUN 7 - 25 mg/dL 11  Creatinine 0.50 - 1.10 mg/dL 0.74  Sodium 135 - 146 mmol/L 141  Potassium 3.5 - 5.3 mmol/L 4.1  Chloride 98 - 110 mmol/L 108  CO2 20 - 32 mmol/L 28  Calcium 8.6 - 10.2 mg/dL 9.4  Total Protein 6.1 - 8.1 g/dL 7.2  Total Bilirubin 0.2 - 1.2 mg/dL 0.4  Alkaline Phos 33 - 115 U/L -  AST 10 - 30 U/L 16  ALT 6 - 29 U/L 15    Discharge instruction: per After Visit Summary and "Baby and Me Booklet".  After visit meds:  Allergies as of 03/10/2019   No Known Allergies     Medication List    STOP taking these medications   Comfort  Fit Maternity Supp Med Misc   lansoprazole 30 MG capsule Commonly known as: PREVACID   omeprazole 20 MG capsule Commonly known as: PriLOSEC   Vitamin D (Ergocalciferol) 1.25 MG (50000 UT) Caps capsule Commonly known as: DRISDOL     TAKE these medications   Breast Pump Misc 1 Device by Does not apply route as needed.   dibucaine 1 % Oint Commonly known as: NUPERCAINAL Place 1 application rectally as needed for hemorrhoids.   ibuprofen 600 MG tablet Commonly known as: ADVIL Take 1 tablet (600 mg total) by mouth every 6 (six) hours. Start taking on: March 11, 2019   loratadine-pseudoephedrine 5-120 MG tablet Commonly known as: CLARITIN-D 12-hour Take 1 tablet by mouth 2 (two) times daily.   prenatal multivitamin Tabs tablet Take 1 tablet by mouth daily at 12 noon.       Diet: routine diet  Activity: Advance as tolerated. Pelvic rest for 6 weeks.   Outpatient follow up:4 weeks Follow up Appt: Future Appointments  Date Time Provider [place]   03/14/2019  9:15 AM Emily Filbert, MD [facility]   Follow up Visit: [place] [facility] [place] [place]. Go in 6 week(s).   Specialty: Obstetrics and Gynecology Contact information: [place], [address] [place] [phone]         Please schedule this patient for Postpartum visit in: 4 weeks with the following provider: Any provider For C/S patients schedule nurse incision check in weeks 2 weeks: no Low risk pregnancy complicated by: TOLAC, H/o CSx2 Delivery mode:  SVD Anticipated Birth Control:  natural family planning PP Procedures needed: None  Schedule Integrated BH visit: no  Newborn Data: Live born female  Birth Weight:  66 APGAR: 8, 9  Newborn Delivery   Birth date/time: 03/09/2019 18:42:00 Delivery type: VBAC, Spontaneous      Baby Feeding: Breast Disposition:home with mother  Julianne Handler, Mallory Shirk  03/10/2019 7:03 PM

## 2019-03-09 NOTE — MAU Note (Signed)
Water broke at 0800, clear fluid, still coming, little bit of blood. ctxs started at 1000, are every 4 min now. Was 1 cm last Thu. Planning to VBAC.

## 2019-03-09 NOTE — H&P (Addendum)
LABOR AND DELIVERY ADMISSION HISTORY AND PHYSICAL NOTE  Mary Mcpherson is a 33 y.o. female G27P2002 with IUP at [redacted]w[redacted]d by LMP with EDD 03/27/19 presenting for SOL, TOLAC, and PROM. Pregnancy complicated by LGA, marginal insertion of umbilical cord, and repeat cesarean section.   Presents with SROM at 0800 this morning with clear leakage of fluid. Denies vaginal bleeding. She reports  fetal movement. Denies headaches, vision changes, chest pain, or lower extremity edema.    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 Anxiety   . Depression   . Dysplasia of cervix 2007  . IUD (intrauterine device) in place 06/22/2013   Placed Jan 2014, removed 05/12/17   . Migraines   . Vaginal Pap smear, abnormal     Past Surgical History: Past Surgical History:  Procedure Laterality Date  . CESAREAN SECTION  2006,2007   x 2  . procedure for dysplasia  2007  . WISDOM TOOTH EXTRACTION  2010    Obstetrical History: OB History    Gravida  3   Para  2   Term  2   Preterm      AB      Living  2     SAB      TAB      Ectopic      Multiple      Live Births              Social History: Social History   Socioeconomic History  . Marital status: Married    Spouse name: Not on file  . Number of children: Not on file  . Years of education: Not on file  . Highest education level: Not on file  Occupational History  . Not on file  Social Needs  . Financial resource strain: Not on file  . Food insecurity    Worry: Not on file    Inability: Not on file  . Transportation needs   Medical: Not on file    Non-medical: Not on file  Tobacco Use  . Smoking status: Never Smoker  . Smokeless tobacco: Never Used  Substance and Sexual Activity  . Alcohol use: Yes    Alcohol/week: 1.0 standard drinks    Types: 1 Glasses of wine per week  . Drug use: No  . Sexual activity: Yes    Partners: Male    Birth control/protection: None  Lifestyle  . Physical activity    Days per week: Not on file    Minutes per session: Not on file  . Stress: Not on file  Relationships  . Social Herbalist on phone: Not on file    Gets together: Not on file    Attends religious service: Not on file    Active member of club or organization: Not on file    Attends meetings of clubs or organizations: Not on file    Relationship status: Not on file  Other Topics Concern  . Not on file  Social History Narrative  . Not on file  Family History: Family History  Problem Relation Age of Onset  . Cancer Maternal Grandmother   . Heart attack Other        parent  . Diabetes Other        parents  . Hypertension Other        paternal grandmother  . Hyperlipidemia Other        paternal granmother  . Heart disease Paternal Grandmother     Allergies: No Known Allergies  Medications Prior to Admission  Medication Sig Dispense Refill Last Dose  . lansoprazole (PREVACID) 30 MG capsule Take 1 capsule (30 mg total) by mouth 2 (two) times daily before a meal. 60 capsule 1 03/09/2019 at Unknown time  . loratadine-pseudoephedrine (CLARITIN-D 12-HOUR) 5-120 MG tablet Take 1 tablet by mouth 2 (two) times daily.   03/08/2019 at Unknown time  . Prenatal Vit-Fe Fumarate-FA (PRENATAL MULTIVITAMIN) TABS tablet Take 1 tablet by mouth daily at 12 noon.   03/08/2019 at Unknown time  . Elastic Bandages & Supports (COMFORT FIT MATERNITY SUPP MED) MISC 1 Device by Does not apply route daily. (Patient not taking: Reported on 02/05/2019) 1 each 0   . Misc. Devices (BREAST PUMP) MISC 1 Device by Does  not apply route as needed. (Patient not taking: Reported on 02/28/2019) 1 each 0   . omeprazole (PRILOSEC) 20 MG capsule Take 1 capsule (20 mg total) by mouth daily. 30 capsule 6  at not taking  . Vitamin D, Ergocalciferol, (DRISDOL) 1.25 MG (50000 UT) CAPS capsule TAKE 1 CAPSULE BY MOUTH EVERY 7 DAYS 4 capsule 1  at not taking     Review of Systems   All systems reviewed and negative except as stated in HPI  Blood pressure 105/79, pulse 94, temperature 98.1 F (36.7 C), temperature source Oral, resp. rate 16, height 5\' 7"  (1.702 m), weight 88.2 kg, last menstrual period 06/20/2018, SpO2 99 %. General appearance: alert Lungs: clear to auscultation bilaterally Heart: regular rate and rhythm Abdomen: soft, non-tender; bowel sounds normal Extremities: No calf swelling or tenderness Presentation: cephalic vertex  Fetal monitoring: Baseline 150, moderate variability (previousouly minimal), -accelerations, - decelerations  Uterine activity: q 1-2 min   Dilation: 1.5 Effacement (%): 90 Station: -3 Exam by:: T LYTLE RN    Prenatal labs: ABO, Rh: O/RH(D) POSITIVE/-- (04/23 1441) Antibody: NO ANTIBODIES DETECTED (04/23 1441) Rubella: 1.56 (04/23 1441) RPR: NON-REACTIVE (08/25 1008)  HBsAg: NON-REACTIVE (04/23 1441)  HIV: NON-REACTIVE (08/25 1008)  GBS:    1 hr Glucola:  WNL Genetic screening:  WNL Anatomy [birth year]1981: 10/08-  Marginal cord insertion, normal anatomy, FW:    2685  gm    5 lb 15 oz      79  %  Prenatal Transfer Tool  Maternal Diabetes: No Genetic Screening: Normal Maternal Ultrasounds/Referrals: Other: LGA  Fetal Ultrasounds or other Referrals:  Referred to Materal Fetal Medicine  Maternal Substance Abuse:  No Significant Maternal Medications:  None Significant Maternal Lab Results: Group B Strep negative  No results found for this or any previous visit (from the past 24 hour(s)).  Patient Active Problem List   Diagnosis Date Noted  . Marginal insertion of umbilical  cord affecting management of mother 11/08/2018  . Previous cesarean delivery affecting pregnancy 08/31/2018  . Supervision of other normal pregnancy, antepartum 08/30/2018  . Migraine 04/14/2015  . Shingles 03/01/2014  . Generalized anxiety disorder 03/01/2014  . History of positive PPD 07/02/2013  . History of cervical dysplasia 06/22/2013    Assessment &  Plan   Tommas OlpJessica Perko is a 33 y.o. female G3P2002 with IUP at 8132w3d by LMP with EDD 03/27/19 presenting for SOL, TOLAC, and PROM. Pregnancy complicated by LGA since resolved, marginal insertion of umbilical cord, repeat cesarean section, anxiety, and depression.    #Labor: Presents in early labor 1 cm dilated with PROM @ 0800. Will hold on augmentation at this time given initial minimal variability. No cyotec given CS hx.  #Pain: Recommend early epidural  #FWB: Cat I (there was some low variability which improved with bolus), [place] 7.5 lbs  #ID: GBS negative  #MOF: Breast  #MOC: Natural Family Planning   #TOLAC: 1st CS 2006 for non-reassuring FHT and arrest of decent after 4 hours of pushing. Scheduled repeat in 2007. [place] Low threshold for rCS if FHT worsens.  #Resolved LGA- US 10/08 FW:    2685  gm    5 lb 15 oz      79  % #Anxiety and Depression - Symptoms are  well controlled. Is not taking medication currently.      Curly ShoresJohn Blaise Ellery, PGY-1, MD Labor and Delivery Teaching service  03/09/2019, 3:12 PM  GME ATTESTATION:  I saw and evaluated the patient. I agree with the findings and the plan of care as documented in the resident's note with the addition of the following:  Surgical history also includes cold knife cone.  The patient was counseled on the increase risk for uterine rupture after having 2 Cesarean deliveries. It was recommended that she be on a clear-liquid diet and get an epidural early in case of repeat Cesarean delivery. The patient was counseled and consented for repeat Cesarean  delivery in case of maternal or fetal indication.  The risks of cesarean section were discussed with the patient including but were not limited to: bleeding which may require transfusion or reoperation; infection which may require antibiotics; injury to bowel, bladder, ureters or other surrounding organs; injury to the fetus; need for additional procedures including hysterectomy in the event of a life-threatening hemorrhage; placental abnormalities wth subsequent pregnancies, incisional problems, thromboembolic phenomenon and other postoperative/anesthesia complications.    Marlowe AltSparacino, Terique Kawabata L, DO OB Fellow, [facility] 03/09/2019 5:33 PM

## 2019-03-09 NOTE — Anesthesia Preprocedure Evaluation (Deleted)
Anesthesia Evaluation    Reviewed: Allergy & Precautions, Patient's Chart, lab work & pertinent test results  Airway        Dental   Pulmonary neg pulmonary ROS,           Cardiovascular Exercise Tolerance: Good negative cardio ROS       Neuro/Psych  Headaches, negative psych ROS   GI/Hepatic negative GI ROS, Neg liver ROS,   Endo/Other    Renal/GU negative Renal ROS     Musculoskeletal negative musculoskeletal ROS (+)   Abdominal   Peds  Hematology Hgb 11.8 Plt 211 T&S  Avail O pos   Anesthesia Other Findings   Reproductive/Obstetrics (+) Pregnancy                             Anesthesia Physical Anesthesia Plan  ASA: II  Anesthesia Plan: Epidural   Post-op Pain Management:    Induction:   PONV Risk Score and Plan:   Airway Management Planned:   Additional Equipment:   Intra-op Plan:   Post-operative Plan:   Informed Consent: I have reviewed the patients History and Physical, chart, labs and discussed the procedure including the risks, benefits and alternatives for the proposed anesthesia with the patient or authorized representative who has indicated his/her understanding and acceptance.       Plan Discussed with:   Anesthesia Plan Comments: (37.3 Wk G3P2  w LGA and  Marginal Cord  Insertion for TOLAC for LEA  )        Anesthesia Quick Evaluation

## 2019-03-09 NOTE — Progress Notes (Signed)
Faculty Note  S: Patient very uncomfortable with contractions, feeling back pain.   O: BP 124/70   Pulse 93   Temp 98.5 F (36.9 C) (Oral)   Resp 18   Ht 5\' 7"  (1.702 m)   Wt 88.2 kg   LMP 06/20/2018   SpO2 99%   BMI 30.45 kg/m   Gen: alert, oriented SVE: deferred  FHT: 145 bpm, moderate variability, accels  present, no decels Toco: ctx every 1-2 min   A/P: Pt is 33 y.o. [id number] @ [redacted]w[redacted]d who is admitted for PPROM in the setting of 2 prior CS. She would like to [facility] and has previously counseled regarding risks of TOLAC. I reviewed that we will let her labor spontaneously but will not induce labor, she and partner verbalize understanding. She has previously signed CS consent and understands that I will recommend repeat c-section in the event of non-reassuring fetal or maternal status. She is very uncomfortable at this point and would like an epidural for pain relief. RN aware and will let anesthesia know.   Feliz Beam, M.D. Attending Center for [employer] Fish farm manager)

## 2019-03-10 DIAGNOSIS — O34219 Maternal care for unspecified type scar from previous cesarean delivery: Secondary | ICD-10-CM

## 2019-03-10 LAB — CBC
HCT: 28.9 % — ABNORMAL LOW (ref 36.0–46.0)
Hemoglobin: 9.8 g/dL — ABNORMAL LOW (ref 12.0–15.0)
MCH: 31.4 pg (ref 26.0–34.0)
MCHC: 33.9 g/dL (ref 30.0–36.0)
MCV: 92.6 fL (ref 80.0–100.0)
Platelets: 225 10*3/uL (ref 150–400)
RBC: 3.12 MIL/uL — ABNORMAL LOW (ref 3.87–5.11)
RDW: 13.1 % (ref 11.5–15.5)
WBC: 12.4 10*3/uL — ABNORMAL HIGH (ref 4.0–10.5)
nRBC: 0 % (ref 0.0–0.2)

## 2019-03-10 LAB — RPR: RPR Ser Ql: NONREACTIVE

## 2019-03-10 MED ORDER — PANTOPRAZOLE SODIUM 40 MG PO TBEC
40.0000 mg | DELAYED_RELEASE_TABLET | Freq: Every day | ORAL | Status: DC
  Administered 2019-03-10: 40 mg via ORAL
  Filled 2019-03-10: qty 1

## 2019-03-10 MED ORDER — HYDROCORT-PRAMOXINE (PERIANAL) 1-1 % EX FOAM
1.0000 | Freq: Two times a day (BID) | CUTANEOUS | Status: DC
  Administered 2019-03-10: 1 via RECTAL
  Filled 2019-03-10: qty 10

## 2019-03-10 MED ORDER — IBUPROFEN 600 MG PO TABS: 600.0000 mg | ORAL_TABLET | Freq: Four times a day (QID) | ORAL | 0 refills | Status: DC

## 2019-03-10 MED ORDER — DIBUCAINE (PERIANAL) 1 % EX OINT: 1.0000 "application " | TOPICAL_OINTMENT | CUTANEOUS | 1 refills | Status: DC | PRN

## 2019-03-10 NOTE — Plan of Care (Signed)
  Problem: Activity: Goal: Risk for activity intolerance will decrease Outcome: Completed/Met   Problem: Nutrition: Goal: Adequate nutrition will be maintained Outcome: Completed/Met   Problem: Elimination: Goal: Will not experience complications related to bowel motility Outcome: Completed/Met   Problem: Pain Managment: Goal: General experience of comfort will improve Outcome: Completed/Met   Problem: Safety: Goal: Ability to remain free from injury will improve Outcome: Completed/Met   Problem: Activity: Goal: Will verbalize the importance of balancing activity with adequate rest periods Outcome: Completed/Met Goal: Ability to tolerate increased activity will improve Outcome: Completed/Met   Problem: Life Cycle: Goal: Chance of risk for complications during the postpartum period will decrease Outcome: Completed/Met   Problem: Role Relationship: Goal: Ability to demonstrate positive interaction with newborn will improve Outcome: Completed/Met

## 2019-03-10 NOTE — Progress Notes (Signed)
POSTPARTUM PROGRESS NOTE  Subjective: Mary Mcpherson is a 33 y.o. G2B6389 s/p VBAC at [redacted]w[redacted]d.  She reports she doing well. No acute events overnight. She denies any problems with ambulating, voiding or po intake. Denies nausea or vomiting. She has passed flatus. Pain is well controlled.  Lochia is appropriate.  Having some soreness with her hemorrhoids.  Objective: Blood pressure 96/63, pulse 66, temperature 98.6 F (37 C), temperature source Oral, resp. rate 18, height 5\' 7"  (1.702 m), weight 88.2 kg, last menstrual period 06/20/2018, SpO2 99 %, unknown if currently breastfeeding.  Physical Exam:  General: alert, cooperative and no distress Chest: no respiratory distress Abdomen: soft, non-tender  Uterine Fundus: firm, appropriately tender Extremities: No calf swelling or tenderness  No edema  Recent Labs    03/09/19 1514 03/10/19 0538  HGB 11.8* 9.8*  HCT 35.4* 28.9*    Assessment/Plan: Mary Mcpherson is a 33 y.o. G3P3003 s/p VBAC at [redacted]w[redacted]d.  Routine Postpartum Care: Doing well, pain well-controlled.  -- Continue routine care, lactation support  -- Contraception: natural family planning -- Feeding: breast -- Proctofoam for hemorrhoids  Dispo: Plan for discharge tomorrow morning.  Merilyn Baba, DO OB/GYN Fellow, [facility] for [facility]

## 2019-03-10 NOTE — Discharge Instructions (Signed)

## 2019-03-10 NOTE — Lactation Note (Signed)
This note was copied from a baby's chart. Lactation Consultation Note  Patient Name: Girl Laron Angelini YFVCB'S Date: 03/10/2019 Reason for consult: Follow-up assessment;Early term 18-38.6wks  P3 mother whose infant is now 44 hours old. This is an ETI at 37+3 weeks.  Mother breast fed her first two children (now 79 and 34 years old) for 3 months each.  Baby was asleep in the bassinet when I arrived.  Mother had no questions/concerns related to breast feeding and informed me that baby has latched and fed well since delivery.  She denies pain with latching and baby is content after feedings.  Mother stated, "It's just like riding a bicycle...once you learn you don't forget."  She seems very confident and comfortable with her breast feeding skills.  Offered to initiate the DEBP to help increase milk supply and to provided supplementation but mother was not interested.  I acknowledged her wishes and asked her to call her RN/LC if she begins to get concerned about baby's ability to breast feed or if she needs any latch assistance.  Mother verbalized understanding.  Mother is familiar with hand expression and required no review.  Encouraged to continue feeding with cues or at least every three hours if baby does not self awaken to feed.    Mother has a DEBP for home use and will return to work after her 12 week leave.  Father present.    Maternal Data Formula Feeding for Exclusion: No Has patient been taught Hand Expression?: Yes Does the patient have breastfeeding experience prior to this delivery?: Yes  Feeding Feeding Type: Breast Fed  LATCH Score                   Interventions    Lactation Tools Discussed/Used     Consult Status Consult Status: Follow-up Date: 03/11/19 Follow-up type: In-patient    Michaelina Blandino R Solae Norling 03/10/2019, 3:02 PM

## 2019-03-10 NOTE — Progress Notes (Signed)
MOB was referred for history of depression/anxiety. * Referral screened out by Clinical Social Worker because none of the following criteria appear to apply: ~ History of anxiety/depression during this pregnancy, or of post-partum depression following prior delivery. ~ Diagnosis of anxiety and/or depression within last 3 years OR * MOB's symptoms currently being treated with medication and/or therapy. Please contact the Clinical Social Worker if needs arise, by MOB request, or if MOB scores greater than 9/yes to question 10 on Edinburgh Postpartum Depression Screen.  Marce Charlesworth Boyd-Gilyard, MSW, LCSW Clinical Social Work [phone]  

## 2019-03-14 ENCOUNTER — Encounter: Payer: BC Managed Care – PPO | Admitting: Obstetrics & Gynecology

## 2019-03-20 ENCOUNTER — Other Ambulatory Visit: Payer: Self-pay | Admitting: Obstetrics & Gynecology

## 2019-03-20 DIAGNOSIS — K219 Gastro-esophageal reflux disease without esophagitis: Secondary | ICD-10-CM

## 2019-04-02 ENCOUNTER — Encounter: Payer: Self-pay | Admitting: Osteopathic Medicine

## 2019-04-02 NOTE — Telephone Encounter (Signed)
Msg copy and pasted into pt's chart

## 2019-04-11 ENCOUNTER — Encounter: Payer: Self-pay | Admitting: Obstetrics and Gynecology

## 2019-04-11 ENCOUNTER — Other Ambulatory Visit: Payer: Self-pay

## 2019-04-11 ENCOUNTER — Ambulatory Visit (INDEPENDENT_AMBULATORY_CARE_PROVIDER_SITE_OTHER): Payer: BC Managed Care – PPO | Admitting: Obstetrics and Gynecology

## 2019-04-11 DIAGNOSIS — Z3009 Encounter for other general counseling and advice on contraception: Secondary | ICD-10-CM

## 2019-04-11 NOTE — Progress Notes (Signed)
Obstetrics/Postpartum Visit  Appointment Date: 04/11/2019  OBGYN [facility]: [facility]  Primary Care Provider: Emeterio Reeve  Chief Complaint:  Chief Complaint  Patient presents with  . Postpartum Care    History of Present Illness: Mary Mcpherson is a 33 y.o. Caucasian [id number] (Patient's last menstrual period was 06/20/2018.), seen for the above chief complaint. Her past medical history is significant for cold knife cone 2007, anxiety, depression.  She is s/p VBAC on 03/09/19 at 37 weeks; she was discharged to home on PPD#1. Pregnancy complicated by 2 prior CS.  Complains of nothing, was sore at first, but overall doing well.  Vaginal bleeding or discharge: No  Breast or formula feeding: breast & bottle Intercourse: No  Contraception: natural family planning PP depression s/s: No  Any bowel or bladder issues: No  Pap smear: no abnormalities (date: 06/2018)  Review of Systems: Positive for n/a.   Her 12 point review of systems is negative or as noted in the History of Present Illness.  Patient Active Problem List   Diagnosis Date Noted  . VBAC, delivered 03/10/2019  . Supervision of high risk pregnancy in third trimester 03/09/2019  . Marginal insertion of umbilical cord affecting management of mother 11/08/2018  . Previous cesarean delivery affecting pregnancy 08/31/2018  . Supervision of other normal pregnancy, antepartum 08/30/2018  . Migraine 04/14/2015  . Shingles 03/01/2014  . Generalized anxiety disorder 03/01/2014  . History of positive PPD 07/02/2013  . History of cervical dysplasia 06/22/2013    Medications Gioia Ranes had no medications administered during this visit. Current Outpatient Medications  Medication Sig Dispense Refill  . ibuprofen (ADVIL) 600 MG tablet Take 1 tablet (600 mg total) by mouth every 6 (six) hours. 30 tablet 0  . loratadine-pseudoephedrine (CLARITIN-D 12-HOUR) 5-120 MG tablet Take 1 tablet by mouth 2 (two) times daily.    .  Prenatal Vit-Fe Fumarate-FA (PRENATAL MULTIVITAMIN) TABS tablet Take 1 tablet by mouth daily at 12 noon.    . dibucaine (NUPERCAINAL) 1 % OINT Place 1 application rectally as needed for hemorrhoids. (Patient not taking: Reported on 04/11/2019) 50 g 1  . Misc. Devices (BREAST PUMP) MISC 1 Device by Does not apply route as needed. (Patient not taking: Reported on 02/28/2019) 1 each 0  . omeprazole (PRILOSEC) 20 MG capsule TAKE 1 CAPSULE BY MOUTH EVERY DAY (Patient not taking: Reported on 04/11/2019) 90 capsule 2   No current facility-administered medications for this visit.     Allergies Patient has no known allergies.  Physical Exam:  BP 112/73   Pulse 91   Resp 16   Ht 5\' 7"  (1.702 m)   Wt 173 lb (78.5 kg)   LMP 06/20/2018   Breastfeeding Yes   BMI 27.10 kg/m  Body mass index is 27.1 kg/m. General appearance: Well nourished, well developed female in no acute distress.  Cardiovascular: regular rate and rhythm Respiratory:  Normal respiratory effort Abdomen: no masses, hernias; diffusely non tender to palpation, non distended Breasts: not examined. Neuro/Psych:  Normal mood and affect.  Skin:  Warm and dry.   Pelvic exam: is not limited by body habitus EGBUS: within normal limits Vagina: within normal limits and with None blood in the vault, perineal laceration appears to be healing well, several small sutures removed, minor soreness in right perineum  PP Depression Screening:   Edinburgh Postnatal Depression Scale - 04/11/19 1007      Edinburgh Postnatal Depression Scale:  In the Past 7 Days   I have been able  to laugh and see the funny side of things.  0    I have looked forward with enjoyment to things.  0    I have blamed myself unnecessarily when things went wrong.  0    I have been anxious or worried for no good reason.  0    I have felt scared or panicky for no good reason.  0    Things have been getting on top of me.  0    I have been so unhappy that I have had  difficulty sleeping.  0    I have felt sad or miserable.  0    I have been so unhappy that I have been crying.  0    The thought of harming myself has occurred to me.  0    Edinburgh Postnatal Depression Scale Total  0       Assessment: Patient is a 33 y.o. [id number] who is 4 weeks post partum from a VBAC. She is doing well, no issues.  Plan:   1. Postpartum state Doing well, no issues  2. Encounter for counseling regarding contraception - Desires Mirena IUD placement, reviewed risks/benefits, she has had before and is very happy with it - will have return 3-4 weeks as she is still somewhat sore today - aware to not have unprotected intercourse 2 weeks prior to insertion   RTC 3-4 week for IUD placement   K. Therese Sarah, M.D. Attending Center for [employer] Midwife)

## 2019-04-11 NOTE — Progress Notes (Signed)
Error

## 2019-05-09 ENCOUNTER — Ambulatory Visit (INDEPENDENT_AMBULATORY_CARE_PROVIDER_SITE_OTHER): Payer: BC Managed Care – PPO | Admitting: Obstetrics & Gynecology

## 2019-05-09 ENCOUNTER — Other Ambulatory Visit: Payer: Self-pay

## 2019-05-09 ENCOUNTER — Encounter: Payer: Self-pay | Admitting: Obstetrics & Gynecology

## 2019-05-09 VITALS — BP 121/79 | HR 82 | Wt 174.0 lb

## 2019-05-09 DIAGNOSIS — Z30430 Encounter for insertion of intrauterine contraceptive device: Secondary | ICD-10-CM

## 2019-05-09 DIAGNOSIS — Z3202 Encounter for pregnancy test, result negative: Secondary | ICD-10-CM | POA: Diagnosis not present

## 2019-05-09 LAB — POCT URINE PREGNANCY: Preg Test, Ur: NEGATIVE

## 2019-05-09 MED ORDER — LEVONORGESTREL 20 MCG/24HR IU IUD: INTRAUTERINE_SYSTEM | Freq: Once | INTRAUTERINE | Status: AC

## 2019-05-09 NOTE — Progress Notes (Signed)
   Subjective:    Patient ID: Mary Mcpherson, female    DOB: [birth year], 33 y.o.   MRN: [id number]  HPI 33 yo married P3 s/p VBAC 8 weeks ago. She is here for Mirena. She has used this method in the past and is happy with it. She has not had sex since the delivery.    Review of Systems Pap normal 07/03/18    Objective:   Physical Exam Breathing, conversing, and ambulating normally Well nourished, well hydrated White female, no apparent distress UPT negative, consent signed, Time out procedure done. Cervix prepped with betadine and grasped with a single tooth tenaculum after spraying Hurricane spray. Mirena was easily placed and the strings were cut to 3-4 cm. Uterus sounded to 9 cm. She tolerated the procedure well.     Assessment & Plan:  Contraception- Mirena Rec back up method for 2 weeks String check in 4 weeks

## 2019-06-01 ENCOUNTER — Other Ambulatory Visit: Payer: Self-pay

## 2019-06-01 ENCOUNTER — Encounter: Payer: Self-pay | Admitting: Certified Nurse Midwife

## 2019-06-01 ENCOUNTER — Ambulatory Visit (INDEPENDENT_AMBULATORY_CARE_PROVIDER_SITE_OTHER): Payer: BC Managed Care – PPO | Admitting: Certified Nurse Midwife

## 2019-06-01 VITALS — BP 111/70 | HR 81 | Temp 98.4°F | Ht 66.0 in | Wt 176.0 lb

## 2019-06-01 DIAGNOSIS — Z30431 Encounter for routine checking of intrauterine contraceptive device: Secondary | ICD-10-CM | POA: Diagnosis not present

## 2019-06-01 DIAGNOSIS — Z975 Presence of (intrauterine) contraceptive device: Secondary | ICD-10-CM

## 2019-06-01 NOTE — Progress Notes (Signed)
GYNECOLOGY OFFICE VISIT NOTE  History:  34 y.o. [id number] here today for IUD string check. Mirena IUD was placed on 05/09/19. She denies any abnormal vaginal discharge, pelvic pain or other concerns. Having occasional spotting. No dyspareunia.   Pap 07/03/18 - negative, next one needed in 2025.  Past Medical History:  Diagnosis Date  . Anxiety   . Depression   . Dysplasia of cervix 2007  . IUD (intrauterine device) in place 06/22/2013   Placed Jan 2014, removed 05/12/17   . Migraines   . Vaginal Pap smear, abnormal     Past Surgical History:  Procedure Laterality Date  . CESAREAN SECTION  2006,2007   x 2  . procedure for dysplasia  2007  . WISDOM TOOTH EXTRACTION  2010    The following portions of the patient's history were reviewed and updated as appropriate: allergies, current medications, past family history, past medical history, past social history, past surgical history and problem list.   Review of Systems:  Pertinent items noted in HPI and remainder of comprehensive ROS otherwise negative.  Objective:  Physical Exam BP 111/70   Pulse 81   Temp 98.4 F (36.9 C)   Ht 5\' 6"  (1.676 m)   Wt 176 lb (79.8 kg)   LMP 05/13/2018   Breastfeeding Yes   BMI 28.41 kg/m  CONSTITUTIONAL: Well-developed, well-nourished female in no acute distress.  HENT:  Normocephalic, atraumatic.  SKIN: Skin is warm and dry. No rash noted. Not diaphoretic. No erythema. No pallor. NEUROLOGIC: Alert and oriented to person, place, and time.  PSYCHIATRIC: Normal mood and affect. Normal behavior. Normal judgment and thought content. CARDIOVASCULAR: Normal heart rate noted RESPIRATORY: Effort and rate normal ABDOMEN: Soft, no distention noted.   PELVIC: Normal appearing external genitalia; normal appearing vaginal mucosa and cervix. IUD string seen. No abnormal discharge noted.    Assessment & Plan:  1. IUD (intrauterine device) in place -IUD in place  - Educated and discussed irregular  spotting/bleeding can last up to 2-3 months after insertion  - Warning signs and reasons to follow up reviewed   Please refer to After Visit Summary for other counseling recommendations.   Return in about 1 year (around 05/31/2020) for WELL WOMAN VISIT.   06/02/2020, CNM 06/01/2019 11:36 AM

## 2019-08-07 ENCOUNTER — Encounter: Payer: Self-pay | Admitting: Certified Nurse Midwife

## 2019-08-07 ENCOUNTER — Other Ambulatory Visit: Payer: Self-pay

## 2019-08-07 ENCOUNTER — Ambulatory Visit (INDEPENDENT_AMBULATORY_CARE_PROVIDER_SITE_OTHER): Payer: BC Managed Care – PPO | Admitting: Certified Nurse Midwife

## 2019-08-07 VITALS — BP 127/89 | HR 86 | Resp 16 | Ht 67.0 in | Wt 172.0 lb

## 2019-08-07 DIAGNOSIS — Z124 Encounter for screening for malignant neoplasm of cervix: Secondary | ICD-10-CM | POA: Diagnosis not present

## 2019-08-07 DIAGNOSIS — Z1151 Encounter for screening for human papillomavirus (HPV): Secondary | ICD-10-CM | POA: Diagnosis not present

## 2019-08-07 DIAGNOSIS — N898 Other specified noninflammatory disorders of vagina: Secondary | ICD-10-CM

## 2019-08-07 DIAGNOSIS — N941 Unspecified dyspareunia: Secondary | ICD-10-CM | POA: Diagnosis not present

## 2019-08-07 DIAGNOSIS — Z809 Family history of malignant neoplasm, unspecified: Secondary | ICD-10-CM | POA: Diagnosis not present

## 2019-08-07 DIAGNOSIS — Z01419 Encounter for gynecological examination (general) (routine) without abnormal findings: Secondary | ICD-10-CM

## 2019-08-07 NOTE — Progress Notes (Signed)
GYNECOLOGY ANNUAL PREVENTATIVE CARE ENCOUNTER NOTE  History:     Mary Mcpherson is a 34 y.o. G2P3003 female here for a routine annual gynecologic exam.  Current complaints: pain with intercourse. Denies abnormal vaginal bleeding, discharge,  or other gynecologic concerns.    Gynecologic History Patient's last menstrual period was 08/06/2019. Contraception: IUD Last Pap: 06/2018. Results were: normal with negative HPV   Obstetric History OB History  Gravida Para Term Preterm AB Living  3 3 3     3   SAB TAB Ectopic Multiple Live Births        0 1    # Outcome Date GA Lbr Len/2nd Weight Sex Delivery Anes PTL Lv  3 Term 03/09/19 [redacted]w[redacted]d 09:08 / 00:34 7 lb 11.1 oz (3.49 kg) F VBAC None  LIV  2 Term      CS-LTranv     1 Term      CS-LTranv       Past Medical History:  Diagnosis Date   Anxiety    Depression    Dysplasia of cervix 2007   IUD (intrauterine device) in place 06/22/2013   Placed Jan 2014, removed 05/12/17    Migraines    Vaginal Pap smear, abnormal     Past Surgical History:  Procedure Laterality Date   CESAREAN SECTION  2006,2007   x 2   procedure for dysplasia  2007   WISDOM TOOTH EXTRACTION  2010    Current Outpatient Medications on File Prior to Visit  Medication Sig Dispense Refill   levonorgestrel (MIRENA) 20 MCG/24HR IUD 1 each by Intrauterine route once.     dibucaine (NUPERCAINAL) 1 % OINT Place 1 application rectally as needed for hemorrhoids. (Patient not taking: Reported on 08/07/2019) 50 g 1   ibuprofen (ADVIL) 600 MG tablet Take 1 tablet (600 mg total) by mouth every 6 (six) hours. (Patient not taking: Reported on 08/07/2019) 30 tablet 0   loratadine-pseudoephedrine (CLARITIN-D 12-HOUR) 5-120 MG tablet Take 1 tablet by mouth 2 (two) times daily.     Misc. Devices (BREAST PUMP) MISC 1 Device by Does not apply route as needed. (Patient not taking: Reported on 08/07/2019) 1 each 0   omeprazole (PRILOSEC) 20 MG capsule TAKE 1 CAPSULE BY  MOUTH EVERY DAY (Patient not taking: Reported on 08/07/2019) 90 capsule 2   Prenatal Vit-Fe Fumarate-FA (PRENATAL MULTIVITAMIN) TABS tablet Take 1 tablet by mouth daily at 12 noon.     No current facility-administered medications on file prior to visit.    No Known Allergies  Social History:  reports that she has never smoked. She has never used smokeless tobacco. She reports current alcohol use of about 1.0 standard drinks of alcohol per week. She reports that she does not use drugs.  Family History  Problem Relation Age of Onset   Cancer Maternal Grandmother    Heart attack Other        parent   Diabetes Other        parents   Hypertension Other        paternal grandmother   Hyperlipidemia Other        paternal granmother   Heart disease Paternal Grandmother     The following portions of the patient's history were reviewed and updated as appropriate: allergies, current medications, past family history, past medical history, past social history, past surgical history and problem list.  Review of Systems Pertinent items noted in HPI and remainder of comprehensive ROS otherwise negative.  Physical Exam:  BP 127/89    Pulse 86    Resp 16    Ht 5\' 7"  (1.702 m)    Wt 172 lb (78 kg)    LMP 08/06/2019    Breastfeeding Yes    BMI 26.94 kg/m  CONSTITUTIONAL: Well-developed, well-nourished female in no acute distress.  HENT:  Normocephalic, atraumatic, External right and left ear normal. Oropharynx is clear and moist EYES: Conjunctivae and EOM are normal. Pupils are equal, round, and reactive to light. NECK: Normal range of motion, supple, no masses.  Normal thyroid.  SKIN: Skin is warm and dry. No rash noted. Not diaphoretic. No erythema. No pallor. MUSCULOSKELETAL: Normal range of motion. No tenderness.  No cyanosis, clubbing, or edema.  2+ distal pulses. NEUROLOGIC: Alert and oriented to person, place, and time. Normal reflexes, muscle tone coordination.  PSYCHIATRIC: Normal  mood and affect. Normal behavior. Normal judgment and thought content. CARDIOVASCULAR: Normal heart rate noted, regular rhythm RESPIRATORY: Clear to auscultation bilaterally. Effort and breath sounds normal, no problems with respiration noted. BREASTS: Symmetric in size. No masses, tenderness, skin changes, nipple drainage, or lymphadenopathy bilaterally. Performed in the presence of a chaperone. ABDOMEN: Soft, no distention noted.  No tenderness, rebound or guarding.  PELVIC: Normal appearing external genitalia and urethral meatus; normal appearing cervix. IUD strings seen- 4cm from cervical os. Small batch of granulation tissue noted where second degree laceration healed. Tenderness with palpation at site.   No abnormal discharge noted.  Pap smear obtained.  Normal uterine size, no other palpable masses, no uterine or adnexal tenderness.  Performed in the presence of a chaperone.   Assessment and Plan:    1. Well woman exam with routine gynecological exam - Patient request annual pap smear  - Cytology - PAP( [place])  2. Granulation tissue of vagina - Granulation tissue treated with silver nitrate - encouraged to abstain from IC for 2-3 days after treatment with silver nitrate  - discussed with patient may need 2nd treatment in 2 weeks, patient verbalizes understanding   3. Dyspareunia in female - Patient reports dyspareunia has been occurring since postpartum period and IUD placement, patient reports dyspareunia occurs with insertion but also with penetration  - patient reports husband has noticed increase tissue from wear stitches healed  - 08/08/2019 PELVIS TRANSVAGINAL NON-OB (TV ONLY); Future  Will follow up results of pap smear/pelvic US and manage accordingly. Routine preventative health maintenance measures emphasized. Please refer to After Visit Summary for other counseling recommendations.        [place], CNM [facility], [facility]

## 2019-08-08 LAB — CYTOLOGY - PAP
Comment: NEGATIVE
Diagnosis: NEGATIVE
High risk HPV: NEGATIVE

## 2019-08-20 ENCOUNTER — Other Ambulatory Visit: Payer: Self-pay

## 2019-08-20 ENCOUNTER — Ambulatory Visit (INDEPENDENT_AMBULATORY_CARE_PROVIDER_SITE_OTHER): Payer: BC Managed Care – PPO

## 2019-08-20 DIAGNOSIS — N941 Unspecified dyspareunia: Secondary | ICD-10-CM

## 2019-08-20 DIAGNOSIS — R102 Pelvic and perineal pain: Secondary | ICD-10-CM | POA: Diagnosis not present

## 2019-08-28 ENCOUNTER — Ambulatory Visit (INDEPENDENT_AMBULATORY_CARE_PROVIDER_SITE_OTHER): Payer: BC Managed Care – PPO | Admitting: Advanced Practice Midwife

## 2019-08-28 ENCOUNTER — Other Ambulatory Visit: Payer: Self-pay

## 2019-08-28 ENCOUNTER — Encounter: Payer: Self-pay | Admitting: Advanced Practice Midwife

## 2019-08-28 VITALS — BP 122/79 | HR 94 | Resp 16 | Ht 67.0 in | Wt 170.0 lb

## 2019-08-28 DIAGNOSIS — L989 Disorder of the skin and subcutaneous tissue, unspecified: Secondary | ICD-10-CM

## 2019-08-28 DIAGNOSIS — N941 Unspecified dyspareunia: Secondary | ICD-10-CM

## 2019-08-28 NOTE — Progress Notes (Signed)
  GYNECOLOGY PROGRESS NOTE  History:  34 y.o. G3P3003 presents to [facility] office today for problem gyn visit. She reports pain at site of her perineal repair and dyspareunia since her vaginal delivery and IUD insertion. She reports feeling a knot or hard tissue at the perineum that is very tender to touch.    The following portions of the patient's history were reviewed and updated as appropriate: allergies, current medications, past family history, past medical history, past social history, past surgical history and problem list. Last pap smear on 08/07/19 was normal.  Review of Systems:  Pertinent items are noted in HPI.   Objective:  Physical Exam Blood pressure 122/79, pulse 94, resp. rate 16, height 5\' 7"  (1.702 m), weight 170 lb (77.1 kg), last menstrual period 08/06/2019, currently breastfeeding. VS reviewed, nursing note reviewed,  Constitutional: well developed, well nourished, no distress HEENT: normocephalic CV: normal rate Pulm/chest wall: normal effort Breast Exam: deferred Abdomen: soft Neuro: alert and oriented x 3 Skin: warm, dry Psych: affect normal Pelvic exam: Cervix pink, visually closed, without lesion, scant white creamy discharge, vaginal walls and external genitalia normal, no edema or erythema, small area of firm tissue palpable but not visible to left side of perineal area.     Assessment & Plan:  1. Perineal irritation in female --Exam wnl with area of healing/scar tissue where perineal sutures were present.  No evidence of infection, no sutures are visible. --Laceration repair well approximated  2. Dyspareunia, female --Likely related to scar tissue that does not stretch as well as other vaginal tissue.  I recommended perineal massage, either by the patient or her partner, and printed and verbal education was given.   --Ibuprofen PRN for pelvic pain/dyspareunia --Pt to f/u in 3 months to see if improvements in comfort with intercourse.  08/08/2019, CNM 9:22 AM

## 2019-08-28 NOTE — Patient Instructions (Signed)
Sciatica  Sciatica is pain, numbness, weakness, or tingling along the path of the sciatic nerve. The sciatic nerve starts in the lower back and runs down the back of each leg. The nerve controls the muscles in the lower leg and in the back of the knee. It also provides feeling (sensation) to the back of the thigh, the lower leg, and the sole of the foot. Sciatica is a symptom of another medical condition that pinches or puts pressure on the sciatic nerve. Sciatica most often only affects one side of the body. Sciatica usually goes away on its own or with treatment. In some cases, sciatica may come back (recur). What are the causes? This condition is caused by pressure on the sciatic nerve or pinching of the nerve. This may be the result of:  A disk in between the bones of the spine bulging out too far (herniated disk).  Age-related changes in the spinal disks.  A pain disorder that affects a muscle in the buttock.  Extra bone growth near the sciatic nerve.  A break (fracture) of the pelvis.  Pregnancy.  Tumor. This is rare. What increases the risk? The following factors may make you more likely to develop this condition:  Playing sports that place pressure or stress on the spine.  Having poor strength and flexibility.  A history of back injury or surgery.  Sitting for long periods of time.  Doing activities that involve repetitive bending or lifting.  Obesity. What are the signs or symptoms? Symptoms can vary from mild to very severe, and they may include:  Any of these problems in the lower back, leg, hip, or buttock: ? Mild tingling, numbness, or dull aches. ? Burning sensations. ? Sharp pains.  Numbness in the back of the calf or the sole of the foot.  Leg weakness.  Severe back pain that makes movement difficult. Symptoms may get worse when you cough, sneeze, or laugh, or when you sit or stand for long periods of time. How is this diagnosed? This condition may be  diagnosed based on:  Your symptoms and medical history.  A physical exam.  Blood tests.  Imaging tests, such as: ? X-rays. ? MRI. ? CT scan. How is this treated? In many cases, this condition improves on its own without treatment. However, treatment may include:  Reducing or modifying physical activity.  Exercising and stretching.  Icing and applying heat to the affected area.  Medicines that help to: ? Relieve pain and swelling. ? Relax your muscles.  Injections of medicines that help to relieve pain, irritation, and inflammation around the sciatic nerve (steroids).  Surgery. Follow these instructions at home: Medicines  Take over-the-counter and prescription medicines only as told by your health care provider.  Ask your health care provider if the medicine prescribed to you: ? Requires you to avoid driving or using heavy machinery. ? Can cause constipation. You may need to take these actions to prevent or treat constipation:  Drink enough fluid to keep your urine pale yellow.  Take over-the-counter or prescription medicines.  Eat foods that are high in fiber, such as beans, whole grains, and fresh fruits and vegetables.  Limit foods that are high in fat and processed sugars, such as fried or sweet foods. Managing pain      If directed, put ice on the affected area. ? Put ice in a plastic bag. ? Place a towel between your skin and the bag. ? Leave the ice on for 20 minutes,   2-3 times a day.  If directed, apply heat to the affected area. Use the heat source that your health care provider recommends, such as a moist heat pack or a heating pad. ? Place a towel between your skin and the heat source. ? Leave the heat on for 20-30 minutes. ? Remove the heat if your skin turns bright red. This is especially important if you are unable to feel pain, heat, or cold. You may have a greater risk of getting burned. Activity   Return to your normal activities as told  by your health care provider. Ask your health care provider what activities are safe for you.  Avoid activities that make your symptoms worse.  Take brief periods of rest throughout the day. ? When you rest for longer periods, mix in some mild activity or stretching between periods of rest. This will help to prevent stiffness and pain. ? Avoid sitting for long periods of time without moving. Get up and move around at least one time each hour.  Exercise and stretch regularly, as told by your health care provider.  Do not lift anything that is heavier than 10 lb (4.5 kg) while you have symptoms of sciatica. When you do not have symptoms, you should still avoid heavy lifting, especially repetitive heavy lifting.  When you lift objects, always use proper lifting technique, which includes: ? Bending your knees. ? Keeping the load close to your body. ? Avoiding twisting. General instructions  Maintain a healthy weight. Excess weight puts extra stress on your back.  Wear supportive, comfortable shoes. Avoid wearing high heels.  Avoid sleeping on a mattress that is too soft or too hard. A mattress that is firm enough to support your back when you sleep may help to reduce your pain.  Keep all follow-up visits as told by your health care provider. This is important. Contact a health care provider if:  You have pain that: ? Wakes you up when you are sleeping. ? Gets worse when you lie down. ? Is worse than you have experienced in the past. ? Lasts longer than 4 weeks.  You have an unexplained weight loss. Get help right away if:  You are not able to control when you urinate or have bowel movements (incontinence).  You have: ? Weakness in your lower back, pelvis, buttocks, or legs that gets worse. ? Redness or swelling of your back. ? A burning sensation when you urinate. Summary  Sciatica is pain, numbness, weakness, or tingling along the path of the sciatic nerve.  This condition  is caused by pressure on the sciatic nerve or pinching of the nerve.  Sciatica can cause pain, numbness, or tingling in the lower back, legs, hips, and buttocks.  Treatment often includes rest, exercise, medicines, and applying ice or heat. This information is not intended to replace advice given to you by your health care provider. Make sure you discuss any questions you have with your health care provider. Document Revised: 05/15/2018 Document Reviewed: 05/15/2018 Elsevier Patient Education  2020 [employer] Sciatica Rehab Ask your health care provider which exercises are safe for you. Do exercises exactly as told by your health care provider and adjust them as directed. It is normal to feel mild stretching, pulling, tightness, or discomfort as you do these exercises. Stop right away if you feel sudden pain or your pain gets worse. Do not begin these exercises until told by your health care provider. Stretching and range-of-motion exercises These exercises warm up   your muscles and joints and improve the movement and flexibility of your hips and back. These exercises also help to relieve pain, numbness, and tingling. Sciatic nerve glide 1. Sit in a chair with your head facing down toward your chest. Place your hands behind your back. Let your shoulders slump forward. 2. Slowly straighten one of your legs while you tilt your head back as if you are looking toward the ceiling. Only straighten your leg as far as you can without making your symptoms worse. 3. Hold this position for __________ seconds. 4. Slowly return to the starting position. 5. Repeat with your other leg. Repeat __________ times. Complete this exercise __________ times a day. Knee to chest with hip adduction and internal rotation  1. Lie on your back on a firm surface with both legs straight. 2. Bend one of your knees and move it up toward your chest until you feel a gentle stretch in your lower back and buttock. Then, move  your knee toward the shoulder that is on the opposite side from your leg. This is hip adduction and internal rotation. ? Hold your leg in this position by holding on to the front of your knee. 3. Hold this position for __________ seconds. 4. Slowly return to the starting position. 5. Repeat with your other leg. Repeat __________ times. Complete this exercise __________ times a day. Prone extension on elbows  1. Lie on your abdomen on a firm surface. A bed may be too soft for this exercise. 2. Prop yourself up on your elbows. 3. Use your arms to help lift your chest up until you feel a gentle stretch in your abdomen and your lower back. ? This will place some of your body weight on your elbows. If this is uncomfortable, try stacking pillows under your chest. ? Your hips should stay down, against the surface that you are lying on. Keep your hip and back muscles relaxed. 4. Hold this position for __________ seconds. 5. Slowly relax your upper body and return to the starting position. Repeat __________ times. Complete this exercise __________ times a day. Strengthening exercises These exercises build strength and endurance in your back. Endurance is the ability to use your muscles for a long time, even after they get tired. Pelvic tilt This exercise strengthens the muscles that lie deep in the abdomen. 1. Lie on your back on a firm surface. Bend your knees and keep your feet flat on the floor. 2. Tense your abdominal muscles. Tip your pelvis up toward the ceiling and flatten your lower back into the floor. ? To help with this exercise, you may place a small towel under your lower back and try to push your back into the towel. 3. Hold this position for __________ seconds. 4. Let your muscles relax completely before you repeat this exercise. Repeat __________ times. Complete this exercise __________ times a day. Alternating arm and leg raises  1. Get on your hands and knees on a firm surface.  If you are on a hard floor, you may want to use padding, such as an exercise mat, to cushion your knees. 2. Line up your arms and legs. Your hands should be directly below your shoulders, and your knees should be directly below your hips. 3. Lift your left leg behind you. At the same time, raise your right arm and straighten it in front of you. ? Do not lift your leg higher than your hip. ? Do not lift your arm higher than your shoulder. ?   Keep your abdominal and back muscles tight. ? Keep your hips facing the ground. ? Do not arch your back. ? Keep your balance carefully, and do not hold your breath. 4. Hold this position for __________ seconds. 5. Slowly return to the starting position. 6. Repeat with your right leg and your left arm. Repeat __________ times. Complete this exercise __________ times a day. Posture and body mechanics Good posture and healthy body mechanics can help to relieve stress in your body's tissues and joints. Body mechanics refers to the movements and positions of your body while you do your daily activities. Posture is part of body mechanics. Good posture means:  Your spine is in its natural S-curve position (neutral).  Your shoulders are pulled back slightly.  Your head is not tipped forward. Follow these guidelines to improve your posture and body mechanics in your everyday activities. Standing   When standing, keep your spine neutral and your feet about hip width apart. Keep a slight bend in your knees. Your ears, shoulders, and hips should line up.  When you do a task in which you stand in one place for a long time, place one foot up on a stable object that is 2-4 inches (5-10 cm) high, such as a footstool. This helps keep your spine neutral. Sitting   When sitting, keep your spine neutral and keep your feet flat on the floor. Use a footrest, if necessary, and keep your thighs parallel to the floor. Avoid rounding your shoulders, and avoid tilting your  head forward.  When working at a desk or a computer, keep your desk at a height where your hands are slightly lower than your elbows. Slide your chair under your desk so you are close enough to maintain good posture.  When working at a computer, place your monitor at a height where you are looking straight ahead and you do not have to tilt your head forward or downward to look at the screen. Resting  When lying down and resting, avoid positions that are most painful for you.  If you have pain with activities such as sitting, bending, stooping, or squatting, lie in a position in which your body does not bend very much. For example, avoid curling up on your side with your arms and knees near your chest (fetal position).  If you have pain with activities such as standing for a long time or reaching with your arms, lie with your spine in a neutral position and bend your knees slightly. Try the following positions: ? Lying on your side with a pillow between your knees. ? Lying on your back with a pillow under your knees. Lifting   When lifting objects, keep your feet at least shoulder width apart and tighten your abdominal muscles.  Bend your knees and hips and keep your spine neutral. It is important to lift using the strength of your legs, not your back. Do not lock your knees straight out.  Always ask for help to lift heavy or awkward objects. This information is not intended to replace advice given to you by your health care provider. Make sure you discuss any questions you have with your health care provider. Document Revised: 08/18/2018 Document Reviewed: 05/18/2018 Elsevier Patient Education  2020 [employer]  

## 2019-10-24 ENCOUNTER — Telehealth (INDEPENDENT_AMBULATORY_CARE_PROVIDER_SITE_OTHER): Payer: BC Managed Care – PPO | Admitting: Family Medicine

## 2019-10-24 ENCOUNTER — Encounter: Payer: Self-pay | Admitting: Family Medicine

## 2019-10-24 DIAGNOSIS — J069 Acute upper respiratory infection, unspecified: Secondary | ICD-10-CM | POA: Diagnosis not present

## 2019-10-24 MED ORDER — HYDROCOD POLST-CPM POLST ER 10-8 MG/5ML PO SUER: 5.0000 mL | Freq: Every evening | ORAL | 0 refills | Status: DC | PRN

## 2019-10-24 MED ORDER — AEROCHAMBER PLUS MISC: 0 refills | Status: DC

## 2019-10-24 MED ORDER — ALBUTEROL SULFATE HFA 108 (90 BASE) MCG/ACT IN AERS: 2.0000 | INHALATION_SPRAY | Freq: Four times a day (QID) | RESPIRATORY_TRACT | 0 refills | Status: DC | PRN

## 2019-10-24 NOTE — Assessment & Plan Note (Signed)
Continue supportive care at home with increased fluids and rest.  Cepacol and warm salt water recommended for sore throat.  Adding albuterol with spacer for cough and intermittent wheezing.  Tussionex cough syrup to use at bedtime. Confirmed she is no longer breastfeeding.  She will call if symptoms are worsening or she develops new symptoms.

## 2019-10-24 NOTE — Progress Notes (Signed)
Mary Mcpherson - 34 y.o. female MRN [id number]  Date of birth: 1985-09-20   This visit type was conducted due to national recommendations for restrictions regarding the COVID-19 Pandemic (e.g. social distancing).  This format is felt to be most appropriate for this patient at this time.  All issues noted in this document were discussed and addressed.  No physical exam was performed (except for noted visual exam findings with Video Visits).  I discussed the limitations of evaluation and management by telemedicine and the availability of in person appointments. The patient expressed understanding and agreed to proceed.  I connected with@ on 10/24/19 at  1:00 PM EDT by a video enabled telemedicine application and verified that I am speaking with the correct person using two identifiers.  Present at visit: Everrett Coombe, DO Tommas Olp   Patient Location[place] [address] [place] [address]   Provider location:   [facility]  No chief complaint on file.   HPI  Mary Mcpherson is a 34 y.o. female who presents via audio/video conferencing for a telehealth visit today.  She has complaint of sore/dry throat, productive cough and congestion for about 5 days.  Has had some mild wheezing.  She denies feeling shortness of breath.  She has not had fever, chills, body aches, headache, sinus pain, nausea, vomiting, diarrhea.  She has tried OTC medications with only mild relief.  Cough is keeping her awake at night.  She is no longer breast feeding.     ROS:  A comprehensive ROS was completed and negative except as noted per HPI  Past Medical History:  Diagnosis Date  . Anxiety   . Depression   . Dysplasia of cervix 2007  . IUD (intrauterine device) in place 06/22/2013   Placed Jan 2014, removed 05/12/17   . Migraines   . Vaginal Pap smear, abnormal     Past Surgical History:  Procedure Laterality Date  . CESAREAN SECTION  2006,2007   x 2  . procedure for dysplasia  2007  . WISDOM TOOTH  EXTRACTION  2010    Family History  Problem Relation Age of Onset  . Cancer Maternal Grandmother   . Heart attack Other        parent  . Diabetes Other        parents  . Hypertension Other        paternal grandmother  . Hyperlipidemia Other        paternal granmother  . Heart disease Paternal Grandmother     Social History   Socioeconomic History  . Marital status: Married    Spouse name: Not on file  . Number of children: Not on file  . Years of education: Not on file  . Highest education level: Not on file  Occupational History  . Not on file  Tobacco Use  . Smoking status: Never Smoker  . Smokeless tobacco: Never Used  Substance and Sexual Activity  . Alcohol use: Yes    Alcohol/week: 1.0 standard drink    Types: 1 Glasses of wine per week  . Drug use: No  . Sexual activity: Yes    Partners: Male    Birth control/protection: None  Other Topics Concern  . Not on file  Social History Narrative  . Not on file   Social Determinants of Health   Financial Resource Strain:   . Difficulty of Paying Living Expenses:   Food Insecurity:   . Worried About Programme researcher, broadcasting/film/video in the Last Year:   .  Ran Out of Food in the Last Year:   Transportation Needs:   . Film/video editor (Medical):   Marland Kitchen Lack of Transportation (Non-Medical):   Physical Activity:   . Days of Exercise per Week:   . Minutes of Exercise per Session:   Stress:   . Feeling of Stress :   Social Connections:   . Frequency of Communication with Friends and Family:   . Frequency of Social Gatherings with Friends and Family:   . Attends Religious Services:   . Active Member of Clubs or Organizations:   . Attends Archivist Meetings:   Marland Kitchen Marital Status:   Intimate Partner Violence:   . Fear of Current or Ex-Partner:   . Emotionally Abused:   Marland Kitchen Physically Abused:   . Sexually Abused:      Current Outpatient Medications:  .  dibucaine (NUPERCAINAL) 1 % OINT, Place 1 application  rectally as needed for hemorrhoids., Disp: 50 g, Rfl: 1 .  ibuprofen (ADVIL) 600 MG tablet, Take 1 tablet (600 mg total) by mouth every 6 (six) hours., Disp: 30 tablet, Rfl: 0 .  levonorgestrel (MIRENA) 20 MCG/24HR IUD, 1 each by Intrauterine route once., Disp: , Rfl:  .  loratadine-pseudoephedrine (CLARITIN-D 12-HOUR) 5-120 MG tablet, Take 1 tablet by mouth 2 (two) times daily., Disp: , Rfl:  .  Prenatal Vit-Fe Fumarate-FA (PRENATAL MULTIVITAMIN) TABS tablet, Take 1 tablet by mouth daily at 12 noon., Disp: , Rfl:  .  albuterol (VENTOLIN HFA) 108 (90 Base) MCG/ACT inhaler, Inhale 2 puffs into the lungs every 6 (six) hours as needed for wheezing or shortness of breath. Use with spacer., Disp: 8 g, Rfl: 0 .  chlorpheniramine-HYDROcodone (TUSSIONEX PENNKINETIC ER) 10-8 MG/5ML SUER, Take 5 mLs by mouth at bedtime as needed for cough., Disp: 75 mL, Rfl: 0 .  Spacer/Aero-Holding Chambers (AEROCHAMBER PLUS) inhaler, Use as instructed, Disp: 1 each, Rfl: 0  EXAM:  VITALS per patient if applicable: Pulse 97   Wt 170 lb (77.1 kg)   BMI 26.63 kg/m   GENERAL: alert, oriented, appears well and in no acute distress  HEENT: atraumatic, conjunttiva clear, no obvious abnormalities on inspection of external nose and ears  NECK: normal movements of the head and neck  LUNGS: on inspection no signs of respiratory distress, breathing rate appears normal, no obvious gross SOB, gasping or wheezing  CV: no obvious cyanosis  MS: moves all visible extremities without noticeable abnormality  PSYCH/NEURO: pleasant and cooperative, no obvious depression or anxiety, speech and thought processing grossly intact  ASSESSMENT AND PLAN:  Discussed the following assessment and plan:  URI (upper respiratory infection) Continue supportive care at home with increased fluids and rest.  Cepacol and warm salt water recommended for sore throat.  Adding albuterol with spacer for cough and intermittent wheezing.  Tussionex  cough syrup to use at bedtime. Confirmed she is no longer breastfeeding.  She will call if symptoms are worsening or she develops new symptoms.   30 minutes spent including pre visit preparation, review of prior notes and labs, encounter with patient via video visit and same day documentation.    I discussed the assessment and treatment plan with the patient. The patient was provided an opportunity to ask questions and all were answered. The patient agreed with the plan and demonstrated an understanding of the instructions.   The patient was advised to call back or seek an in-person evaluation if the symptoms worsen or if the condition fails to improve as anticipated.  Luetta Nutting, DO

## 2019-10-24 NOTE — Progress Notes (Signed)
Dry throat x 5 days Productive cough x 2 days  Drinking hot tea and Halls for sore throat.

## 2019-10-29 ENCOUNTER — Ambulatory Visit: Payer: BC Managed Care – PPO | Admitting: Obstetrics & Gynecology

## 2019-11-21 DIAGNOSIS — H1032 Unspecified acute conjunctivitis, left eye: Secondary | ICD-10-CM | POA: Diagnosis not present

## 2020-01-06 ENCOUNTER — Encounter: Payer: Self-pay | Admitting: Osteopathic Medicine

## 2020-03-28 ENCOUNTER — Encounter: Payer: Self-pay | Admitting: Certified Nurse Midwife

## 2020-03-28 ENCOUNTER — Ambulatory Visit (INDEPENDENT_AMBULATORY_CARE_PROVIDER_SITE_OTHER): Payer: BC Managed Care – PPO | Admitting: Women's Health

## 2020-03-28 ENCOUNTER — Other Ambulatory Visit: Payer: Self-pay

## 2020-03-28 VITALS — BP 124/77 | HR 107 | Ht 67.0 in | Wt 177.0 lb

## 2020-03-28 DIAGNOSIS — Z30432 Encounter for removal of intrauterine contraceptive device: Secondary | ICD-10-CM | POA: Diagnosis not present

## 2020-03-28 NOTE — Patient Instructions (Signed)
Natural Family Planning  Natural Family Planning (NFP) is a type of birth control (contraception) in which no form of contraceptive medicine or device is used. The NFP method relies on knowing which days of the month a woman's ovary is producing an egg (ovulation). Ovulation is the time in the menstrual cycle when a woman is most fertile and, therefore, most likely to become pregnant. To lower the chance of pregnancy, sex is avoided during ovulation. NFP is a safe method of birth control and can prevent pregnancy if it is done correctly. However, NFP does not provide protection from sexually transmitted diseases. NFP can also be used as a method of getting pregnant, by deciding to have sex during ovulation. How does the NFP method work? NFP works by making both sexual partners aware of how the woman's body functions during her menstrual cycle.  Usually, a woman has a menstrual period every 28-30 days. However, there can be 23-35 days between each menstrual period. This varies for each woman. A woman with a 28-day menstrual cycle has about 6 days a month when she is most likely to get pregnant.  Ovulation happens 12-14 days before the start of the next menstrual period. There are different methods that are used to determine when ovulation starts.  An egg is fertile for 24 hours after it is released from the ovary. Sperm can live for 3 days or more. What NFP methods can be used to prevent pregnancy? The basal body temperature method During ovulation, there is often a slight increase in body temperature. To use this method:  Take your temperature every morning before getting out of bed. Write the temperature on a chart.  Do not have sex from the day the menstrual periods starts until 3 days after the increase in temperature. Note that body temperature may increase as a result of various factors, including fever, restless sleep, and working schedules. The cervical mucus method Right before  ovulation, mucus from the lower part of the uterus (cervix) changes from dry and sticky to wet and slippery. To use this method:  Check the mucus every day to look for these changes. Ovulation happens on the last day of wet, slippery mucus.  Do not have sex starting when you first see wet, slippery mucus and until 4 days after it stops or returns to its normal consistency.  With this method, it is safe to have sex: ? After the 4 days have passed, and until 10 days after the menstrual period starts. ? On days when mucus is dry. Note that cervical mucus can increase or change consistency due to reasons other than ovulation, such as infection, lubricants, some medicines, and sexual arousal. Other variations of the cervical mucus method include the TwoDay method, Billings ovulation method, and the Creighton model. The symptothermal method This method combines the basal body temperature and the cervical mucus methods. The calendar method This method involves tracking menstrual cycles to determine when ovulation occurs. This method is helpful when the menstrual cycle varies in length. To use this method:  For 6 months, record when menstrual periods start and end and the length of each menstrual cycle. The length of a menstrual cycle is from day 1 of the present menstrual period to day 1 of the next menstrual period.  Use this information to determine when you will likely ovulate. Avoid sex during that time. You may need help from your health care provider to determine which days you are most likely to get pregnant. ?   Ovulation usually happens 12-14 days before the start of the next menstrual period. ? Light vaginal bleeding (spotting) or abdominal cramps during the middle of a menstrual cycle may be signs of ovulation. However, not all women have these symptoms. The standard days method This method is based on studies of women's hormone levels throughout normal menstrual cycles. Based on these  studies, a standard rule was developed to predict when women are most fertile during their menstrual cycle. According to the rule, if your cycle is 26-32 days long, you are most fertile between days 8 and 19. To prevent pregnancy, you should avoid having sex during this time, or use a barrier method of birth control. This method works best if your cycle is regularly between 26-32 days long. When should I not use the NFP method? You should not use NFP if:  You have very irregular menstrual periods or you sometimes skip a menstrual period.  You have abnormal vaginal bleeding.  You recently had a baby or are breastfeeding.  You have a vaginal or cervical infection.  You take medicines that can affect vaginal mucus or body temperature. These medicines include antibiotics, thyroid medicines, and antihistamines that are found in some cold and allergy medicines.  You absolutely do not want to become pregnant at the current time. Other methods of contraception are more effective at preventing pregnancy than NFP.  You are concerned about sexually transmitted diseases. Summary  Natural Family Planning methods help women and their partners to understand how to avoid pregnancy without using medicines or other methods.  A woman learns to recognize her most fertile days. A woman with a 28 day menstrual cycle has about 6 days per month when she can get pregnant. This information is not intended to replace advice given to you by your health care provider. Make sure you discuss any questions you have with your health care provider. Document Revised: 08/18/2018 Document Reviewed: 06/14/2016 Elsevier Patient Education  2020 Elsevier Inc.  

## 2020-03-28 NOTE — Progress Notes (Signed)
Ms. Devetta Hagenow is a 34 y.o. [phone] here for IUD removal. Pt reports she is having issues with mood changes recently, including irritability and is concerned it might be related to her IUD. Discussed pharmacology related to hormones of IUD (pt reports she has Mirena) and discussed that since patient reports she had it in the past without mood changes, mood changes are unlikely to be related to IUD. Patient can identify new stressful situations that are currently present in her life were not present at the time she had her first IUD. Patient declines offer to see behavioral health at this time.  Pt does not wish to get pregnant at this time, but reports she will use natural family planning, which she has used successfully in the past. Discussed what to expect with return of fertility, and patient advised to abstain from intercourse or use condoms until after her first normal menses returns. Procedure and instant return to fertility reviewed. Informed consent signed. Last Pap 07/2019 - NILM/HPV neg.  Speculum inserted and 2 IUD strings visualized at os. Strings grasped with ring forceps. IUD removed without difficulty, intact and shown to patient. Pt tolerated procedure well.  Tinea Nobile, Odie Sera, NP  2:50 PM 03/29/2020

## 2020-04-10 ENCOUNTER — Encounter: Payer: Self-pay | Admitting: Osteopathic Medicine

## 2020-05-05 ENCOUNTER — Encounter: Payer: Self-pay | Admitting: Sports Medicine

## 2020-05-07 ENCOUNTER — Encounter: Payer: Self-pay | Admitting: Osteopathic Medicine

## 2020-05-13 ENCOUNTER — Other Ambulatory Visit: Payer: Self-pay

## 2020-05-13 ENCOUNTER — Encounter: Payer: Self-pay | Admitting: Sports Medicine

## 2020-05-13 ENCOUNTER — Ambulatory Visit (INDEPENDENT_AMBULATORY_CARE_PROVIDER_SITE_OTHER): Payer: BC Managed Care – PPO

## 2020-05-13 ENCOUNTER — Ambulatory Visit (INDEPENDENT_AMBULATORY_CARE_PROVIDER_SITE_OTHER): Payer: BC Managed Care – PPO | Admitting: Sports Medicine

## 2020-05-13 DIAGNOSIS — N926 Irregular menstruation, unspecified: Secondary | ICD-10-CM | POA: Diagnosis not present

## 2020-05-13 DIAGNOSIS — M545 Low back pain, unspecified: Secondary | ICD-10-CM | POA: Diagnosis not present

## 2020-05-13 DIAGNOSIS — M4126 Other idiopathic scoliosis, lumbar region: Secondary | ICD-10-CM | POA: Diagnosis not present

## 2020-05-13 DIAGNOSIS — M5416 Radiculopathy, lumbar region: Secondary | ICD-10-CM | POA: Insufficient documentation

## 2020-05-13 DIAGNOSIS — M48061 Spinal stenosis, lumbar region without neurogenic claudication: Secondary | ICD-10-CM

## 2020-05-13 MED ORDER — CYCLOBENZAPRINE HCL 10 MG PO TABS: ORAL_TABLET | ORAL | 0 refills | Status: DC

## 2020-05-13 MED ORDER — METHYLPREDNISOLONE SODIUM SUCC 125 MG IJ SOLR: 125.0000 mg | Freq: Once | INTRAMUSCULAR | 0 refills | Status: DC

## 2020-05-13 MED ORDER — METHYLPREDNISOLONE SODIUM SUCC 125 MG IJ SOLR
125.0000 mg | Freq: Once | INTRAMUSCULAR | Status: AC
  Administered 2020-05-13: 125 mg via INTRAMUSCULAR

## 2020-05-13 MED ORDER — PREDNISONE 50 MG PO TABS: ORAL_TABLET | ORAL | 0 refills | Status: DC

## 2020-05-13 MED ORDER — KETOROLAC TROMETHAMINE 60 MG/2ML IM SOLN
30.0000 mg | Freq: Once | INTRAMUSCULAR | Status: AC
  Administered 2020-05-13: 30 mg via INTRAMUSCULAR

## 2020-05-13 MED ORDER — HYDROCODONE-ACETAMINOPHEN 10-325 MG PO TABS: 1.0000 | ORAL_TABLET | Freq: Three times a day (TID) | ORAL | 0 refills | Status: DC | PRN

## 2020-05-13 NOTE — Assessment & Plan Note (Signed)
Mary Mcpherson's LMP was 04/09/2020, she feels as though she has missed her most recent cycle, she did recently have a Mirena removed about a month ago, so this is expected.

## 2020-05-13 NOTE — Progress Notes (Signed)
    Procedures performed today:    None.  Independent interpretation of notes and tests performed by another provider:   None.  Brief History, Exam, Impression, and Recommendations:    Left lumbar radiculitis Mary Mcpherson is a pleasant 35 year old female, recently postpartum, unfortunately she has started to have severe low back pain with radiation down the left leg to the outer 2-3 toes on her left foot without bowel or bladder dysfunction, saddle numbness, or constitutional symptoms, no trauma.  No progressive weakness. She had an uneventful VBAC delivery. Symptoms are worse with sitting, flexion, Valsalva. We discussed the evolution and anthropology of degenerative disc disease, as well as the treatment plan which will include Toradol and Solu-Medrol here in the office, 5 days of prednisone, Flexeril, hydrocodone for severe pain, x-rays and formal physical therapy. Return to see me in 6 weeks, MRI for interventional planning if no better.  Irregular menses Mary Mcpherson's LMP was 04/09/2020, she feels as though she has missed her most recent cycle, she did recently have a Mirena removed about a month ago, so this is expected.    ___________________________________________ [name]. Benjamin Stain, M.D., ABFM., CAQSM. Primary Care and Sports Medicine [facility]  Adjunct Instructor of Family Medicine  [employer]

## 2020-05-13 NOTE — Assessment & Plan Note (Signed)
Mary Mcpherson is a pleasant 35 year old female, recently postpartum, unfortunately she has started to have severe low back pain with radiation down the left leg to the outer 2-3 toes on her left foot without bowel or bladder dysfunction, saddle numbness, or constitutional symptoms, no trauma.  No progressive weakness. She had an uneventful VBAC delivery. Symptoms are worse with sitting, flexion, Valsalva. We discussed the evolution and anthropology of degenerative disc disease, as well as the treatment plan which will include Toradol and Solu-Medrol here in the office, 5 days of prednisone, Flexeril, hydrocodone for severe pain, x-rays and formal physical therapy. Return to see me in 6 weeks, MRI for interventional planning if no better.

## 2020-05-19 ENCOUNTER — Ambulatory Visit (INDEPENDENT_AMBULATORY_CARE_PROVIDER_SITE_OTHER): Payer: BC Managed Care – PPO | Admitting: Physical Therapy

## 2020-05-19 ENCOUNTER — Encounter: Payer: Self-pay | Admitting: Physical Therapy

## 2020-05-19 ENCOUNTER — Other Ambulatory Visit: Payer: Self-pay

## 2020-05-19 DIAGNOSIS — R29898 Other symptoms and signs involving the musculoskeletal system: Secondary | ICD-10-CM

## 2020-05-19 DIAGNOSIS — M5442 Lumbago with sciatica, left side: Secondary | ICD-10-CM

## 2020-05-19 DIAGNOSIS — M62830 Muscle spasm of back: Secondary | ICD-10-CM | POA: Diagnosis not present

## 2020-05-19 NOTE — Therapy (Signed)
[facility] [place] [address] [place], [place], [address] Phone: [phone]   Fax:  [phone]  Physical Therapy Evaluation  Patient Details  Name: Mary Mcpherson MRN: [id number] Date of Birth: 26-Apr-1986 Referring Provider (PT): Dr Benjamin Stain   Encounter Date: 05/19/2020   PT End of Session - 05/19/20 1014    Visit Number 1    Number of Visits 12    Date for PT Re-Evaluation 06/30/20    Authorization Type BCBS    PT Start Time 1014    PT Stop Time 1112    PT Time Calculation (min) 58 min    Activity Tolerance Patient limited by pain    Behavior During Therapy Anxious;Restless           Past Medical History:  Diagnosis Date  . Anxiety   . Depression   . Dysplasia of cervix 2007  . IUD (intrauterine device) in place 06/22/2013   Placed Jan 2014, removed 05/12/17   . Migraines   . Vaginal Pap smear, abnormal     Past Surgical History:  Procedure Laterality Date  . CESAREAN SECTION  2006,2007   x 2  . procedure for dysplasia  2007  . WISDOM TOOTH EXTRACTION  2010    There were no vitals filed for this visit.    Subjective Assessment - 05/19/20 1014    Subjective Pt reports that she has finished her prednisone - didn't feel any relief.  She has had pain for the last 10 yrs after moving here.  She tweaked her back and has had intermittent pain since this.  The pain increased after the birth of her daughter last year and then two weeks ago the leg went numb.    Diagnostic tests xray    Patient Stated Goals get rid of pain    Currently in Pain? Yes    Pain Score 9     Pain Location Leg   numbness and tingling into Lt leg   Pain Orientation Left    Pain Descriptors / Indicators --   like someone is cutting her leg off   Pain Type Acute pain    Pain Radiating Towards lateral thigh    Pain Onset 1 to 4 weeks ago    Pain Frequency Constant    Aggravating Factors  standing , walking and lying on her back     Pain Relieving Factors Rt s/l with upper body elevation              [facility] PT Assessment - 05/19/20 0001      Assessment   Medical Diagnosis Lt lumbar radiculitis    Referring Provider (PT) Dr Benjamin Stain    Onset Date/Surgical Date 05/05/20    Next MD Visit 06/24/2020    Prior Therapy none      Precautions   Precautions None      Balance Screen   Has the patient fallen in the past 6 months No    Has the patient had a decrease in activity level because of a fear of falling?  No    Is the patient reluctant to leave their home because of a fear of falling?  No      Home Environment   Living Environment Private residence    Home Layout Two level   unable to do at this time.     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      Observation/Other Assessments   Focus on Therapeutic Outcomes (FOTO)  26      Sensation   Additional Comments numbness lateral Lt thigh and into foot      Posture/Postural Control   Posture/Postural Control Postural limitations    Postural Limitations Flexed trunk;Weight shift right    Posture Comments uable to stand upright d/t pain      ROM / Strength   AROM / PROM / Strength AROM;Strength      AROM   Overall AROM Comments not assessed d/t patients level of pain - not able to tolerate    AROM Assessment Site Hip;Lumbar      Strength   Overall Strength Comments unable to tolerate assessment    Strength Assessment Site Hip;Knee;Ankle      Palpation   Spinal mobility unable to tolerate    Palpation comment tight and tender in Lt lumbar and lower thoracic paraspinals, gluts , piriformis TFL and ITB      Special Tests   Other special tests not performed as pt unable to tolerate                      Objective measurements completed on examination: See above findings.       [facility] Adult PT Treatment/Exercise - 05/19/20  0001      Exercises   Exercises Other Exercises    Other Exercises  prone TA, quadruped cat cow in very limited ROm, self TPR with ball on wall to gluts and low back.      Modalities   Modalities Electrical Stimulation;Moist Heat      Moist Heat Therapy   Number Minutes Moist Heat 15 Minutes    Moist Heat Location Lumbar Spine   in prone     Electrical Stimulation   Electrical Stimulation Location Lt lumbar and gluts    Electrical Stimulation Action IFC   in prone   Electrical Stimulation Parameters to tolerance    Electrical Stimulation Goals Tone;Pain      Manual Therapy   Manual Therapy Soft tissue mobilization    Soft tissue mobilization in prone, STM to Lt lumbar and lower thoracic paraspinals, Lt gluts, piriformis and TFL                  PT Education - 05/19/20 1308    Education Details HEP, POC, home TENs    Person(s) Educated Patient    Methods Explanation;Demonstration;Handout    Comprehension Returned demonstration;Verbalized understanding            PT Short Term Goals - 05/19/20 1303      PT SHORT TERM GOAL #1   Title i with initial HEP    Time 3    Period Weeks    Status New    Target Date 06/09/20      PT SHORT TERM GOAL #2   Title assess ROM and strength    Time 3    Period Weeks    Status New    Target Date 06/09/20             PT Long Term Goals - 05/19/20 1304      PT LONG TERM GOAL #1   Title i with advanced HEP and demo safe bending and lifting of child and money trays at work    Time 6    Period Weeks    Status New    [employer]  Date 06/30/20      PT LONG TERM GOAL #2   Title improve FOTO FS =/> 56    Time 6    Period Weeks    Status New    Target Date 06/30/20      PT LONG TERM GOAL #3   Title =/> 75% reduction of back and Lt Leg symptoms to allow her to return to normal activity    Time 6    Period Weeks    Status New    Target Date 06/30/20      PT LONG TERM GOAL #4   Title set ROM and strength goals as  neede.      PT LONG TERM GOAL #5   Title be able to bend forward and lift her daughter with safe mechanics and no more than 2/10 back pain    Time 6    Period Weeks    Status New    Target Date 06/30/20                  Plan - 05/19/20 1108    Clinical Impression Statement 35 yo female with ~ 2 wk h/o severe Lt leg pain.  She just finished a dose pack of prednisone without any relief, using pain meds with minimal relief.  She is currently out of work however is scheduled to return this week.  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  She presented to the clinic shifting her weight to the Rt buttock in sitting, unable to assume and upright posture and antalgic gait with limited WBing on Lt LE due to pain.  Her most comfortable position was semi reclined Rt sidelying.  ROM and strengthwas not assessed d/t intolerance and high pain level.  She had a lot of tightness and tenderness in the Lt lower thoracic and lumbar paraspinals, gluts, piriformis and TFL.  This decreased with manual work and she was then able to assume a prone position.  She also had relief with heat and stim.  [name] was able to stand upright at end of session, still decreased WBing on Lt LE during stance phase however improved from beginning of session.  She would benifit from tx to reduce muscular spasms and pain and restore PLOF.  She would also benifit from lifting education and core stability exercise as she has a young child at home andhas to lift at work.    Personal Factors and Comorbidities Comorbidity 2    Comorbidities anxiety, depression, migraines, 2 C sections and one vaginal birth ( ~ 1 yr ago)    Examination-Activity Limitations Locomotion Level;Transfers;Bed Mobility;Reach Overhead;Bend;Caring for Others;Squat;Stairs;Stand;Toileting;Lift    Examination-Participation Restrictions Other;Occupation;Shop;Interpersonal Relationship    Stability/Clinical Decision Making Evolving/Moderate  complexity    Clinical Decision Making Moderate    Rehab Potential Good    PT Frequency 2x / week    PT Duration 6 weeks    PT Treatment/Interventions Taping;Patient/family education;Functional mobility training;Moist Heat;Traction;Iontophoresis 4mg /ml Dexamethasone;Passive range of motion;Therapeutic activities;Cryotherapy;Electrical Stimulation;Manual techniques;Spinal Manipulations;Dry needling;Therapeutic exercise    PT Next Visit Plan assess ROM and strength if doing better, manual work to Lt low back/buttocks, progress HEP if able    PT Home Exercise Plan Access Code: [email or web address]           Patient will benefit from skilled therapeutic intervention in order to improve the following deficits and impairments:  Abnormal gait,Decreased range of motion,Difficulty walking,Increased muscle spasms,Pain,Postural dysfunction  Visit Diagnosis: Acute left-sided low back pain  with left-sided sciatica - Plan: PT plan of care cert/re-cert  Muscle spasm of back - Plan: PT plan of care cert/re-cert  Other symptoms and signs involving the musculoskeletal system - Plan: PT plan of care cert/re-cert     Problem List Patient Active Problem List   Diagnosis Date Noted  . Left lumbar radiculitis 05/13/2020  . Irregular menses 05/13/2020  . URI (upper respiratory infection) 10/24/2019  . VBAC, delivered 03/10/2019  . Supervision of high risk pregnancy in third trimester 03/09/2019  . Marginal insertion of umbilical cord affecting management of mother 11/08/2018  . Previous cesarean delivery affecting pregnancy 08/31/2018  . Supervision of other normal pregnancy, antepartum 08/30/2018  . Migraine 04/14/2015  . Shingles 03/01/2014  . Generalized anxiety disorder 03/01/2014  . History of positive PPD 07/02/2013  . History of cervical dysplasia 06/22/2013    Darl Pikes Mickaela Starlin PT  05/19/2020, 1:11 PM  [facility] [address] [place],  [place], [address] Phone: [phone]   Fax:  [phone]  Name: Kabrea Seeney MRN: [id number] Date of Birth: 21-Jun-1985

## 2020-05-19 NOTE — Patient Instructions (Signed)
Access Code: [email or web address] URL: [email or web address] Date: 05/19/2020 Prepared by: Roderic Scarce  Exercises Prone Transversus Abdominus Contraction - 2-3 x daily - 10 reps - 5 hold Cat Cow - 1-2 x daily - 10 reps Standing Glute Med Mobilization with Small Ball on Wall - 30sec hold  Patient Education TENS Therapy TENS Unit

## 2020-05-22 ENCOUNTER — Other Ambulatory Visit: Payer: Self-pay

## 2020-05-22 DIAGNOSIS — M5416 Radiculopathy, lumbar region: Secondary | ICD-10-CM

## 2020-05-22 MED ORDER — CYCLOBENZAPRINE HCL 10 MG PO TABS: ORAL_TABLET | ORAL | 0 refills | Status: DC

## 2020-05-22 MED ORDER — HYDROCODONE-ACETAMINOPHEN 10-325 MG PO TABS: 1.0000 | ORAL_TABLET | Freq: Three times a day (TID) | ORAL | 0 refills | Status: DC | PRN

## 2020-05-22 NOTE — Telephone Encounter (Signed)
Filled 05/13/2020

## 2020-05-26 ENCOUNTER — Encounter: Payer: BC Managed Care – PPO | Admitting: Physical Therapy

## 2020-06-02 ENCOUNTER — Other Ambulatory Visit: Payer: Self-pay

## 2020-06-02 ENCOUNTER — Encounter: Payer: Self-pay | Admitting: Physical Therapy

## 2020-06-02 ENCOUNTER — Ambulatory Visit (INDEPENDENT_AMBULATORY_CARE_PROVIDER_SITE_OTHER): Payer: BC Managed Care – PPO | Admitting: Physical Therapy

## 2020-06-02 DIAGNOSIS — M5442 Lumbago with sciatica, left side: Secondary | ICD-10-CM

## 2020-06-02 DIAGNOSIS — R29898 Other symptoms and signs involving the musculoskeletal system: Secondary | ICD-10-CM | POA: Diagnosis not present

## 2020-06-02 DIAGNOSIS — M62830 Muscle spasm of back: Secondary | ICD-10-CM | POA: Diagnosis not present

## 2020-06-02 NOTE — Therapy (Signed)
[facility] [place] [address] [place] [address] [place], [place], [address] Phone: [phone]   Fax:  [phone]  Physical Therapy Treatment  Patient Details  Name: Mary Mcpherson MRN: [id number] Date of Birth: [birth year] Referring Provider (PT): Dr Benjamin Stain   Encounter Date: 06/02/2020   PT End of Session - 06/02/20 1000    Visit Number 2    Number of Visits 12    Date for PT Re-Evaluation 06/30/20    Authorization Type BCBS    PT Start Time 0803    PT Stop Time 0848    PT Time Calculation (min) 45 min    Activity Tolerance Patient limited by pain    Behavior During Therapy Anxious           Past Medical History:  Diagnosis Date  . Anxiety   . Depression   . Dysplasia of cervix 2007  . IUD (intrauterine device) in place 06/22/2013   Placed Jan 2014, removed 05/12/17   . Migraines   . Vaginal Pap smear, abnormal     Past Surgical History:  Procedure Laterality Date  . CESAREAN SECTION  2006,2007   x 2  . procedure for dysplasia  2007  . WISDOM TOOTH EXTRACTION  2010    There were no vitals filed for this visit.   Subjective Assessment - 06/02/20 0936    Subjective Pt reports she has persistant pain in Lt buttocks and back of Lt leg, along with numbness in 5th Lt toe.  She voices frustration of limited change in symptoms.  She states she is managing the pain with pain medication 3x/day, with limited relief.    Diagnostic tests xray    Patient Stated Goals get rid of pain    Currently in Pain? Yes    Pain Score 6     Pain Location Buttocks    Pain Orientation Left    Pain Descriptors / Indicators Numbness;Sharp;Shooting;Tightness    Pain Onset 1 to 4 weeks ago    Aggravating Factors  standing, walking    Pain Relieving Factors Rt sidelying              [facility] PT Assessment - 06/02/20 0001      Assessment   Medical Diagnosis Lt lumbar radiculitis    Referring Provider (PT) Dr Benjamin Stain    Onset Date/Surgical  Date 05/05/20    Next MD Visit 06/24/2020    Prior Therapy none      Palpation   SI assessment  Rt sacral torsion, Lt ASIS higher than Rt            [facility] Adult PT Treatment/Exercise - 06/02/20 0001      Self-Care   Self-Care Other Self-Care Comments    Other Self-Care Comments  trial of self release with ball to Lt ant hip/ no tightness/tenderness found.      Lumbar Exercises: Stretches   Single Knee to Chest Stretch Left;2 reps;30 seconds    Lower Trunk Rotation Limitations gentle rocking with LE on green pball - good stretch with knees to right, increased pain in glute with Lt rotation (avoided)    Standing Extension 1 rep;5 seconds    Prone on Elbows Stretch 4 reps;20 seconds    Quad Stretch Left;1 rep;20 seconds    Quad Stretch Limitations pain in Lt SI at end range; PTA assist due to guarding    Piriformis Stretch Left;2 reps;20 seconds   hooklying, bringing knee to opp shoulder     Lumbar Exercises:  Seated   Sit to Stand 5 reps   cues for core engagement   Other Seated Lumbar Exercises trial- seated on dynadisc with gentle pelvic tilts (limited tolerance)      Lumbar Exercises: Supine   Ab Set 5 reps;5 seconds   tactile cues for engagement   Bridge Limitations 3 reps, very limited tolerance      Modalities   Modalities --   deferred; pt to use TENS and ice/heat at home.     Manual Therapy   Manual Therapy Manual Traction;Soft tissue mobilization;Passive ROM;Neural Stretch    Soft tissue mobilization prone; TPR and STM to Lt glute med/hip rotators with/without ER/IR of LLE.    Passive ROM Lt quad stretch; limited tolerance for movement of LLE.    Manual Traction long leg traction to LLE x 30 sec x 2 reps    Neural Stretch slides and glides to LLE;  LLE nerve flossing in hooklying with Lt hip flexed and flexing/extending knee                  PT Education - 06/02/20 0959    Education Details issued samples of biofreeze and rock sauce.  verbally added  hooklying nerve glides for LE and prone on elbows stretch.  initiated posture/body Administrator) Educated Patient    Methods Explanation;Tactile cues;Verbal cues    Comprehension Returned demonstration;Verbalized understanding            PT Short Term Goals - 05/19/20 1303      PT SHORT TERM GOAL #1   Title i with initial HEP    Time 3    Period Weeks    Status New    Target Date 06/09/20      PT SHORT TERM GOAL #2   Title assess ROM and strength    Time 3    Period Weeks    Status New    Target Date 06/09/20             PT Long Term Goals - 05/19/20 1304      PT LONG TERM GOAL #1   Title i with advanced HEP and demo safe bending and lifting of child and money trays at work    Time 6    Period Weeks    Status New    Target Date 06/30/20      PT LONG TERM GOAL #2   Title improve FOTO FS =/> 56    Time 6    Period Weeks    Status New    Target Date 06/30/20      PT LONG TERM GOAL #3   Title =/> 75% reduction of back and Lt Leg symptoms to allow her to return to normal activity    Time 6    Period Weeks    Status New    Target Date 06/30/20      PT LONG TERM GOAL #4   Title set ROM and strength goals as neede.      PT LONG TERM GOAL #5   Title be able to bend forward and lift her daughter with safe mechanics and no more than 2/10 back pain    Time 6    Period Weeks    Status New    Target Date 06/30/20                 Plan - 06/02/20 6734    Clinical Impression Statement Pt presents with some pelvis asymmetries -  in prone has Rt sacral torsion, in supine Lt ASIS higher than Rt.   Limited tolerance for exercises and muscles very guarded throughout.  Pt is point tender in Lt glute med, piriformis, biceps femoris; improved slightly with STM to area with pt in prone.  She was unable to tolerate bridge today due to increased buttocks pain.  She reported significant relief of symptoms with long leg traction to LLE along with  slides and glides  to LLE (gentle flossing series with DF, then knee ext, then knee to chest). Pt was able to sit straighter and stand upright with only buttock pain at end of session.   With transitional movements, hamstring pain returned despite cues for neutral spine and core engagement.  Encouraged pt to be mindful of posture and seek neutral spine positions.  Goals are ongoing.    Personal Factors and Comorbidities Comorbidity 2    Comorbidities anxiety, depression, migraines, 2 C sections and one vaginal birth ( ~ 1 yr ago)    Examination-Activity Limitations Locomotion Level;Transfers;Bed Mobility;Reach Overhead;Bend;Caring for Others;Squat;Stairs;Stand;Toileting;Lift    Examination-Participation Restrictions Other;Occupation;Shop;Interpersonal Relationship    Stability/Clinical Decision Making Evolving/Moderate complexity    Rehab Potential Good    PT Frequency 2x / week    PT Duration 6 weeks    PT Treatment/Interventions Taping;Patient/family education;Functional mobility training;Moist Heat;Traction;Iontophoresis 4mg /ml Dexamethasone;Passive range of motion;Therapeutic activities;Cryotherapy;Electrical Stimulation;Manual techniques;Spinal Manipulations;Dry needling;Therapeutic exercise    PT Next Visit Plan assess ROM and strength if doing better, manual work to Lt low back/buttocks, traction (?); review body mechanics and core engagement.    PT Home Exercise Plan Access Code: [email or web address]    Consulted and Agree with Plan of Care Patient           Patient will benefit from skilled therapeutic intervention in order to improve the following deficits and impairments:  Abnormal gait,Decreased range of motion,Difficulty walking,Increased muscle spasms,Pain,Postural dysfunction  Visit Diagnosis: Acute left-sided low back pain with left-sided sciatica  Muscle spasm of back  Other symptoms and signs involving the musculoskeletal system     Problem List Patient Active Problem List   Diagnosis Date Noted   . Left lumbar radiculitis 05/13/2020  . Irregular menses 05/13/2020  . URI (upper respiratory infection) 10/24/2019  . VBAC, delivered 03/10/2019  . Supervision of high risk pregnancy in third trimester 03/09/2019  . Marginal insertion of umbilical cord affecting management of mother 11/08/2018  . Previous cesarean delivery affecting pregnancy 08/31/2018  . Supervision of other normal pregnancy, antepartum 08/30/2018  . Migraine 04/14/2015  . Shingles 03/01/2014  . Generalized anxiety disorder 03/01/2014  . History of positive PPD 07/02/2013  . History of cervical dysplasia 06/22/2013   06/24/2013, PTA 06/02/20 12:35 PM  [facility] [place] [address] [place] [place], [place], [place] Phone: [phone]   Fax:  [phone]  Name: Mary Mcpherson MRN: Tommas Olp Date of Birth: [birth year]

## 2020-06-03 DIAGNOSIS — M5416 Radiculopathy, lumbar region: Secondary | ICD-10-CM

## 2020-06-04 ENCOUNTER — Other Ambulatory Visit: Payer: Self-pay

## 2020-06-04 DIAGNOSIS — M5416 Radiculopathy, lumbar region: Secondary | ICD-10-CM

## 2020-06-04 MED ORDER — HYDROCODONE-ACETAMINOPHEN 10-325 MG PO TABS: 1.0000 | ORAL_TABLET | Freq: Three times a day (TID) | ORAL | 0 refills | Status: DC | PRN

## 2020-06-10 ENCOUNTER — Other Ambulatory Visit: Payer: Self-pay

## 2020-06-10 ENCOUNTER — Ambulatory Visit (INDEPENDENT_AMBULATORY_CARE_PROVIDER_SITE_OTHER): Payer: BC Managed Care – PPO | Admitting: Physical Therapy

## 2020-06-10 DIAGNOSIS — M5442 Lumbago with sciatica, left side: Secondary | ICD-10-CM | POA: Diagnosis not present

## 2020-06-10 DIAGNOSIS — M62830 Muscle spasm of back: Secondary | ICD-10-CM | POA: Diagnosis not present

## 2020-06-10 DIAGNOSIS — R29898 Other symptoms and signs involving the musculoskeletal system: Secondary | ICD-10-CM

## 2020-06-10 NOTE — Therapy (Signed)
[place], [place], [address] Phone: [phone]   Fax:  [phone]  Physical Therapy Treatment  Patient Details  Name: Mary Mcpherson MRN: [id number] Date of Birth: 26-Feb-1986 Referring Provider (PT): Dr Dianah Field   Encounter Date: 06/10/2020   PT End of Session - 06/10/20 0916    Visit Number 3    Number of Visits 12    Date for PT Re-Evaluation 06/30/20    Authorization Type BCBS    PT Start Time 0810   pt arrived late   PT Stop Time 0903    PT Time Calculation (min) 53 min    Activity Tolerance Patient tolerated treatment well    Behavior During Therapy [facility] for tasks assessed/performed           Past Medical History:  Diagnosis Date  . Anxiety   . Depression   . Dysplasia of cervix 2007  . IUD (intrauterine device) in place 06/22/2013   Placed Jan 2014, removed 05/12/17   . Migraines   . Vaginal Pap smear, abnormal     Past Surgical History:  Procedure Laterality Date  . CESAREAN SECTION  2006,2007   x 2  . procedure for dysplasia  2007  . WISDOM TOOTH EXTRACTION  2010    There were no vitals filed for this visit.   Subjective Assessment - 06/10/20 0811    Subjective Pt reports some improvement with stretches and TENS use. She states she can not stand longer than 3 minutes.  She can sit for prolonged time in cushioned chair without many symptoms. Continued numbness down lateral LLE into fifth toe, remains unchanged.  She has been managing her pain with TENS unit and pain medication.  She sits to shower (Lt knee to chest, Rt leg folded to side) since she can stand for long.    Currently in Pain? Yes    Pain Score 2    with TENS, without TENS 5/10   Pain Location Leg    Pain Orientation Left    Pain Descriptors / Indicators Shooting;Stabbing    Pain Radiating Towards buttocks to lateral knee    Aggravating Factors  standing    Pain Relieving Factors sitting               [facility] PT Assessment - 06/10/20 0001      Assessment   Medical Diagnosis Lt lumbar radiculitis    Referring Provider (PT) Dr Dianah Field    Onset Date/Surgical Date 05/05/20    Next MD Visit 06/24/2020    Prior Therapy none      Palpation   SI assessment  Rt sacral torsion; Lt ASIS higher than Rt.              [facility] Adult PT Treatment/Exercise - 06/10/20 0001      Self-Care   Self-Care Posture    Posture Pt shown images educating her on musculature of back/LE/ and pelvis and how it relates to posture. Pt shown alternate ways to stand with neutral pelvis (ie: foot on small stool). Pt verbalized understanding.      Lumbar Exercises: Stretches   Passive Hamstring Stretch Left;1 rep;20 seconds   hooklying; limited range and tolerance.   Single Knee to Chest Stretch Left;1 rep;20 seconds    Hip Flexor Stretch Left;2 reps;30 seconds   Lt arm overhead.   Prone on Elbows Stretch 4 reps;60 seconds    Press Ups 1 rep;5 seconds   improved tolerance.  Quad Stretch Left;2 reps;30 seconds   quad stretch.   Piriformis Stretch Left;2 reps;30 seconds   hooklying bringing knee to opp shoulder   Piriformis Stretch Limitations one rep with fig 4 and pulling Rt leg up x 15 sec      Lumbar Exercises: Supine   Bridge 5 reps;3 seconds    Other Supine Lumbar Exercises LLE nerve flossing with hip/knee flexed 90 deg and flex/ext the knee with foot flexed x 10.      Modalities   Modalities Traction      Traction   Type of Traction Lumbar    Min (lbs) 35    Max (lbs) 50    Hold Time 60    Rest Time 20    Time 12      Manual Therapy   Manual Therapy Muscle Energy Technique    Manual Traction long leg traction to LLE x 30 sec x 2 reps    Muscle Energy Technique MET to correct post rotated Lt inominate; minimal change afterwards. MET to correct Rt sacral torsion with pt in prone; level pelvis afterwards.    Neural Stretch slides and glides to LLE;  LLE nerve flossing in hooklying with Lt  hip flexed and flexing/extending knee                    PT Short Term Goals - 05/19/20 1303      PT SHORT TERM GOAL #1   Title i with initial HEP    Time 3    Period Weeks    Status New    Target Date 06/09/20      PT SHORT TERM GOAL #2   Title assess ROM and strength    Time 3    Period Weeks    Status New    Target Date 06/09/20             PT Long Term Goals - 05/19/20 1304      PT LONG TERM GOAL #1   Title i with advanced HEP and demo safe bending and lifting of child and money trays at work    Time 6    Period Weeks    Status New    Target Date 06/30/20      PT LONG TERM GOAL #2   Title improve FOTO FS =/> 56    Time 6    Period Weeks    Status New    Target Date 06/30/20      PT LONG TERM GOAL #3   Title =/> 75% reduction of back and Lt Leg symptoms to allow her to return to normal activity    Time 6    Period Weeks    Status New    Target Date 06/30/20      PT LONG TERM GOAL #4   Title set ROM and strength goals as neede.      PT LONG TERM GOAL #5   Title be able to bend forward and lift her daughter with safe mechanics and no more than 2/10 back pain    Time 6    Period Weeks    Status New    Target Date 06/30/20                 Plan - 06/10/20 0917    Clinical Impression Statement Pt wearing TENS unit upon arrival and during treatment, per pt preference.  Pt able to tolerate bridge today, without increase in pain.  Continued  limitation in tolerance for Lt hamstring stretch; added LLE nerve flossing in supine with good tolerance.  Lt ASIS higher than Rt and Rt sacral torsion present.  Pt tolerated 50# of pull on traction well, reporting less pain in leg during treatment.  Pt gradually progressing towards goals.    Personal Factors and Comorbidities Comorbidity 2    Comorbidities anxiety, depression, migraines, 2 C sections and one vaginal birth ( ~ 1 yr ago)    Examination-Activity Limitations Locomotion Level;Transfers;Bed  Mobility;Reach Overhead;Bend;Caring for Others;Squat;Stairs;Stand;Toileting;Lift    Examination-Participation Restrictions Other;Occupation;Shop;Interpersonal Relationship    Stability/Clinical Decision Making Evolving/Moderate complexity    Rehab Potential Good    PT Frequency 2x / week    PT Duration 6 weeks    PT Treatment/Interventions Taping;Patient/family education;Functional mobility training;Moist Heat;Traction;Iontophoresis 63m/ml Dexamethasone;Passive range of motion;Therapeutic activities;Cryotherapy;Electrical Stimulation;Manual techniques;Spinal Manipulations;Dry needling;Therapeutic exercise    PT Next Visit Plan assess goals -ROM and strength if doing better, manual work to Lt low back/buttocks, traction; progress HEP.    PT Home Exercise Plan Access Code: [email or web address]    Consulted and Agree with Plan of Care Patient           Patient will benefit from skilled therapeutic intervention in order to improve the following deficits and impairments:  Abnormal gait,Decreased range of motion,Difficulty walking,Increased muscle spasms,Pain,Postural dysfunction  Visit Diagnosis: Acute left-sided low back pain with left-sided sciatica  Muscle spasm of back  Other symptoms and signs involving the musculoskeletal system     Problem List Patient Active Problem List   Diagnosis Date Noted  . Left lumbar radiculitis 05/13/2020  . Irregular menses 05/13/2020  . URI (upper respiratory infection) 10/24/2019  . VBAC, delivered 03/10/2019  . Supervision of high risk pregnancy in third trimester 03/09/2019  . Marginal insertion of umbilical cord affecting management of mother 11/08/2018  . Previous cesarean delivery affecting pregnancy 08/31/2018  . Supervision of other normal pregnancy, antepartum 08/30/2018  . Migraine 04/14/2015  . Shingles 03/01/2014  . Generalized anxiety disorder 03/01/2014  . History of positive PPD 07/02/2013  . History of cervical dysplasia 06/22/2013    JKerin Perna PTA 06/10/20 9:30 AM  [place] [address]Phone: [phone]  Fax:  [phone] Name: Mary BocanegraMRN: [id number] of Birth: [birth year]

## 2020-06-16 ENCOUNTER — Other Ambulatory Visit: Payer: Self-pay

## 2020-06-16 DIAGNOSIS — M5416 Radiculopathy, lumbar region: Secondary | ICD-10-CM

## 2020-06-17 ENCOUNTER — Encounter: Payer: Self-pay | Admitting: Physical Therapy

## 2020-06-17 ENCOUNTER — Other Ambulatory Visit: Payer: Self-pay

## 2020-06-17 ENCOUNTER — Ambulatory Visit (INDEPENDENT_AMBULATORY_CARE_PROVIDER_SITE_OTHER): Payer: BC Managed Care – PPO | Admitting: Physical Therapy

## 2020-06-17 DIAGNOSIS — M5442 Lumbago with sciatica, left side: Secondary | ICD-10-CM | POA: Diagnosis not present

## 2020-06-17 DIAGNOSIS — R29898 Other symptoms and signs involving the musculoskeletal system: Secondary | ICD-10-CM

## 2020-06-17 DIAGNOSIS — M62830 Muscle spasm of back: Secondary | ICD-10-CM

## 2020-06-17 NOTE — Patient Instructions (Signed)

## 2020-06-17 NOTE — Therapy (Signed)
[place], [place], [address] Phone: [phone]   Fax:  [phone]  Physical Therapy Treatment  Patient Details  Name: Mary Mcpherson MRN: [id number] Date of Birth: 07-Aug-1985 Referring Provider (PT): Dr Dianah Field   Encounter Date: 06/17/2020   PT End of Session - 06/17/20 1306    Visit Number 4    Number of Visits 12    Date for PT Re-Evaluation 06/30/20    Authorization Type BCBS    PT Start Time 0804    PT Stop Time 0855    PT Time Calculation (min) 51 min    Activity Tolerance Patient tolerated treatment well    Behavior During Therapy [facility] for tasks assessed/performed           Past Medical History:  Diagnosis Date  . Anxiety   . Depression   . Dysplasia of cervix 2007  . IUD (intrauterine device) in place 06/22/2013   Placed Jan 2014, removed 05/12/17   . Migraines   . Vaginal Pap smear, abnormal     Past Surgical History:  Procedure Laterality Date  . CESAREAN SECTION  2006,2007   x 2  . procedure for dysplasia  2007  . WISDOM TOOTH EXTRACTION  2010    There were no vitals filed for this visit.   Subjective Assessment - 06/17/20 0808    Subjective Pt reports she may have overdone it over the weekend, with laundry and organizing craft room.  She continues with radicular symptoms into LLE and left fifth toe.  She has been completing HEP 2x/day.    Currently in Pain? Yes    Pain Score 2  (5/10 without TENS on)   Pain Location Leg    Pain Orientation Left    Pain Descriptors / Indicators Tightness;Sore    Aggravating Factors  prolonged standing/walking    Pain Relieving Factors sitting              [facility] PT Assessment - 06/17/20 0001      Assessment   Medical Diagnosis Lt lumbar radiculitis    Referring Provider (PT) Dr Dianah Field    Onset Date/Surgical Date 05/05/20    Next MD Visit 06/24/2020    Prior Therapy none      AROM   Lumbar Flexion 75% limited    fingertips to midthigh   Lumbar Extension 50% limited.    Lumbar - Right Side Bend WNL    Lumbar - Left Side Bend 50% limited             [facility] Adult PT Treatment/Exercise - 06/17/20 0001      Lumbar Exercises: Stretches   Passive Hamstring Stretch Left;2 reps;30 seconds   seated with hip hinge   Hip Flexor Stretch Left;2 reps;30 seconds;60 seconds   Lt arm overhead.   Standing Extension 1 rep;5 seconds    Prone on Elbows Stretch 1 rep;20 seconds    Quad Stretch Left;3 reps;20 seconds    Piriformis Stretch Left;1 rep;30 seconds    Other Lumbar Stretch Exercise standing Lt lateral shifts x 5 reps, 10 sec    Other Lumbar Stretch Exercise Rt sidelying over yoga mat and black bolster x 2 min - improved pain in LLE.      Lumbar Exercises: Aerobic   Nustep L4-5, legs only: 6 min  40-50 SPM for warm up.      Lumbar Exercises: Seated   Sit to Stand --   2 reps with core engaged.  Lumbar Exercises: Prone   Opposite Arm/Leg Raise 5 reps;Right arm/Left leg;Left arm/Right leg   2 sets     Traction   Type of Traction Lumbar    Min (lbs) 45    Max (lbs) 60    Hold Time 60    Rest Time 20    Time 15                  PT Education - 06/17/20 1305    Education Details body mechanics info, added opp arm/leg lift.    Person(s) Educated Patient    Methods Explanation;Verbal cues;Demonstration;Handout    Comprehension Verbalized understanding            PT Short Term Goals - 06/17/20 0845      PT SHORT TERM GOAL #1   Title i with initial HEP    Time 3    Period Weeks    Status Achieved    Target Date 06/09/20      PT SHORT TERM GOAL #2   Title assess ROM and strength    Time 3    Period Weeks    Status Partially Met    Target Date 06/09/20             PT Long Term Goals - 06/17/20 2297      PT LONG TERM GOAL #1   Title i with advanced HEP and demo safe bending and lifting of child and money trays at work    Time 6    Period Weeks    Status On-going       PT LONG TERM GOAL #2   Title improve FOTO FS =/> 56    Time 6    Period Weeks    Status On-going      PT LONG TERM GOAL #3   Title =/> 75% reduction of back and Lt Leg symptoms to allow her to return to normal activity    Baseline reduction with use of TENS unit.    Time 6    Period Weeks    Status Partially Met      PT LONG TERM GOAL #4   Title set ROM and strength goals as needed    Status On-going      PT LONG TERM GOAL #5   Title be able to bend forward and lift her daughter with safe mechanics and no more than 2/10 back pain    Time 6    Period Weeks    Status On-going                 Plan - 06/17/20 1303    Clinical Impression Statement Able to assess some of lumbar ROM today, however this spiked pt's pain temporarily.  She tolerated exercises well and reported decreased radicular symptoms of pain in LLE with Rt sidelying over bolster.  She tolerated increased pull for traction well; reported no pain during traction - however numbness in Lt 5th toe present.  Pt making gradual progress each visit.    Personal Factors and Comorbidities Comorbidity 2    Comorbidities anxiety, depression, migraines, 2 C sections and one vaginal birth ( ~ 1 yr ago)    Examination-Activity Limitations Locomotion Level;Transfers;Bed Mobility;Reach Overhead;Bend;Caring for Others;Squat;Stairs;Stand;Toileting;Lift    Examination-Participation Restrictions Other;Occupation;Shop;Interpersonal Relationship    Stability/Clinical Decision Making Evolving/Moderate complexity    Rehab Potential Good    PT Frequency 2x / week    PT Duration 6 weeks    PT Treatment/Interventions Taping;Patient/family education;Functional  mobility training;Moist Heat;Traction;Iontophoresis 39m/ml Dexamethasone;Passive range of motion;Therapeutic activities;Cryotherapy;Electrical Stimulation;Manual techniques;Spinal Manipulations;Dry needling;Therapeutic exercise    PT Next Visit Plan assess ROM and strength if  doing better, manual work to Lt low back/buttocks, traction; progress HEP.    PT Home Exercise Plan Access Code: [email or web address]    Consulted and Agree with Plan of Care Patient           Patient will benefit from skilled therapeutic intervention in order to improve the following deficits and impairments:  Abnormal gait,Decreased range of motion,Difficulty walking,Increased muscle spasms,Pain,Postural dysfunction  Visit Diagnosis: Acute left-sided low back pain with left-sided sciatica  Muscle spasm of back  Other symptoms and signs involving the musculoskeletal system     Problem List Patient Active Problem List   Diagnosis Date Noted  . Left lumbar radiculitis 05/13/2020  . Irregular menses 05/13/2020  . URI (upper respiratory infection) 10/24/2019  . VBAC, delivered 03/10/2019  . Supervision of high risk pregnancy in third trimester 03/09/2019  . Marginal insertion of umbilical cord affecting management of mother 11/08/2018  . Previous cesarean delivery affecting pregnancy 08/31/2018  . Supervision of other normal pregnancy, antepartum 08/30/2018  . Migraine 04/14/2015  . Shingles 03/01/2014  . Generalized anxiety disorder 03/01/2014  . History of positive PPD 07/02/2013  . History of cervical dysplasia 06/22/2013   JKerin Perna PTA 06/17/20 1:08 PM  [facility] [place] [address]Phone: [phone]  Fax:  [phone] Name: JVianka ErtelMRN: [id number] of Birth: [birth year]

## 2020-06-23 ENCOUNTER — Other Ambulatory Visit: Payer: Self-pay

## 2020-06-23 ENCOUNTER — Encounter: Payer: Self-pay | Admitting: Physical Therapy

## 2020-06-23 ENCOUNTER — Ambulatory Visit (INDEPENDENT_AMBULATORY_CARE_PROVIDER_SITE_OTHER): Payer: BC Managed Care – PPO | Admitting: Physical Therapy

## 2020-06-23 DIAGNOSIS — R29898 Other symptoms and signs involving the musculoskeletal system: Secondary | ICD-10-CM | POA: Diagnosis not present

## 2020-06-23 DIAGNOSIS — M62830 Muscle spasm of back: Secondary | ICD-10-CM | POA: Diagnosis not present

## 2020-06-23 DIAGNOSIS — M5442 Lumbago with sciatica, left side: Secondary | ICD-10-CM

## 2020-06-23 NOTE — Patient Instructions (Signed)

## 2020-06-23 NOTE — Therapy (Addendum)
[place], [place], [address] Phone: [phone]   Fax:  [phone]  Physical Therapy Treatment and Discharge  Patient Details  Name: Mary Mcpherson MRN: [id number] Date of Birth: [birth year] Referring Provider (PT): Dr Dianah Field   Encounter Date: 06/23/2020   PT End of Session - 06/23/20 1032    Visit Number 5    Number of Visits 12    Date for PT Re-Evaluation 06/30/20    Authorization Type BCBS    PT Start Time 1020    PT Stop Time 1108    PT Time Calculation (min) 48 min    Activity Tolerance Patient limited by pain    Behavior During Therapy [facility] for tasks assessed/performed           Past Medical History:  Diagnosis Date  . Anxiety   . Depression   . Dysplasia of cervix 2007  . IUD (intrauterine device) in place 06/22/2013   Placed Jan 2014, removed 05/12/17   . Migraines   . Vaginal Pap smear, abnormal     Past Surgical History:  Procedure Laterality Date  . CESAREAN SECTION  2006,2007   x 2  . procedure for dysplasia  2007  . WISDOM TOOTH EXTRACTION  2010    There were no vitals filed for this visit.   Subjective Assessment - 06/23/20 1024    Subjective Pt reports she laid on couch yesterday all day due to pain.  She "had to play catch up" over weekend.  She believes some of the issue is the constant up and down at work.  She forgot her TENS unit at home; took advil this morning.    Patient Stated Goals get rid of pain    Currently in Pain? Yes    Pain Score 5     Pain Location Leg    Pain Orientation Left    Pain Descriptors / Indicators Sore;Tightness    Aggravating Factors  prolonged standing/ walking    Pain Relieving Factors sitting;  Rt sidelying over bolster.              [facility] PT Assessment - 06/23/20 0001      Assessment   Medical Diagnosis Lt lumbar radiculitis    Referring Provider (PT) Dr Dianah Field    Onset Date/Surgical Date 05/05/20    Next  MD Visit 06/24/2020    Prior Therapy none      Observation/Other Assessments   Focus on Therapeutic Outcomes (FOTO)  24 = physical FS           [facility] Adult PT Treatment/Exercise - 06/23/20 0001      Lumbar Exercises: Stretches   Passive Hamstring Stretch Left;2 reps;30 seconds   seated, straight back.   Passive Hamstring Stretch Limitations and LLE nerve flossing with foot pump.    Single Knee to Chest Stretch Left    Hip Flexor Stretch Left;2 reps;30 seconds    Prone on Elbows Stretch 3 reps;10 seconds    Piriformis Stretch Left;1 rep;30 seconds   seated     Lumbar Exercises: Quadruped   [place] 5 reps      Moist Heat Therapy   Number Minutes Moist Heat 10 Minutes    Moist Heat Location --   Lt post hip / thigh     Electrical Stimulation   Electrical Stimulation Location Lt glutes and hamstring   Electrical Stimulation Action IFC    Electrical Stimulation Parameters intensity to tolerance  Electrical Stimulation Goals Pain      Manual Therapy   Manual therapy comments skilled palpation and monitoring of soft tissue during DN    Soft tissue mobilization STM to Lt hamstring, ITB, glute med, piriformis.   STM to [place] felt better after manual work compared to before         NuStep - L4, x 5.5 min to tolerance for warm up.    Trigger Point Dry Needling - 06/23/20 0001    Consent Given? Yes    Education Handout Provided Yes    Muscles Treated Back/Hip Gluteus medius;Gluteus maximus;Piriformis   Lt with stim   Electrical Stimulation Performed with Dry Needling Yes   Lt gluts   Gluteus Medius Response Palpable increased muscle length;Twitch response elicited    Gluteus Maximus Response Palpable increased muscle length;Twitch response elicited    Piriformis Response Palpable increased muscle length                PT Education - 06/23/20 1042    Education Details DN info    Person(s) Educated Patient    Methods Explanation;Handout     Comprehension Verbalized understanding            PT Short Term Goals - 06/17/20 0845      PT SHORT TERM GOAL #1   Title i with initial HEP    Time 3    Period Weeks    Status Achieved    Target Date 06/09/20      PT SHORT TERM GOAL #2   Title assess ROM and strength    Time 3    Period Weeks    Status Partially Met    Target Date 06/09/20             PT Long Term Goals - 06/23/20 1048      PT LONG TERM GOAL #1   Title i with advanced HEP and demo safe bending and lifting of child and money trays at work    Time 6    Period Weeks    Status Partially Met      PT [place] #2   Title improve FOTO FS =/> 56    Time 6    Period Weeks    Status On-going      PT LONG TERM GOAL #3   Title =/> 75% reduction of back and Lt Leg symptoms to allow her to return to normal activity    Baseline reduction with use of TENS unit.    Time 6    Period Weeks    Status Partially Met      PT LONG TERM GOAL #4   Title set ROM and strength goals as needed    Baseline pt unable to tolerate strength/ROM assessment    Status Not Met      PT LONG TERM GOAL #5   Title be able to bend forward and lift her daughter with safe mechanics and no more than 2/10 back pain    Baseline safe mechanics demo'd; pain increases over 2/10    Time 6    Period Weeks    Status On-going                 Plan - 06/23/20 1044    Clinical Impression Statement Pt had increased soreness in low back after last session of traction.  Numbness in Lt 5th toe and tightness in Lt hamstring/ glute remains persistant. Limited tolerance for exercises  today.  Trial of DN to Lt hip/thigh performed by supervising PT, Jeral Pinch.  Pt has made gradual progress towards LTGs with overall lower pain level and improved ROM/body mechanics compared to eval.    Personal Factors and Comorbidities Comorbidity 2    Comorbidities anxiety, depression, migraines, 2 C sections and one vaginal birth ( ~ 1 yr ago)     Examination-Activity Limitations Locomotion Level;Transfers;Bed Mobility;Reach Overhead;Bend;Caring for Others;Squat;Stairs;Stand;Toileting;Lift    Examination-Participation Restrictions Other;Occupation;Shop;Interpersonal Relationship    Stability/Clinical Decision Making Evolving/Moderate complexity    Rehab Potential Good    PT Frequency 2x / week    PT Duration 6 weeks    PT Treatment/Interventions Taping;Patient/family education;Functional mobility training;Moist Heat;Traction;Iontophoresis 74m/ml Dexamethasone;Passive range of motion;Therapeutic activities;Cryotherapy;Electrical Stimulation;Manual techniques;Spinal Manipulations;Dry needling;Therapeutic exercise    PT Next Visit Plan assess response to DN; progress HEP. await advisement from MD.    PT Home Exercise Plan Access Code: [email or web address]    Consulted and Agree with Plan of Care Patient           Patient will benefit from skilled therapeutic intervention in order to improve the following deficits and impairments:  Abnormal gait,Decreased range of motion,Difficulty walking,Increased muscle spasms,Pain,Postural dysfunction  Visit Diagnosis: Acute left-sided low back pain with left-sided sciatica  Muscle spasm of back  Other symptoms and signs involving the musculoskeletal system     Problem List Patient Active Problem List   Diagnosis Date Noted  . Left lumbar radiculitis 05/13/2020  . Irregular menses 05/13/2020  . URI (upper respiratory infection) 10/24/2019  . VBAC, delivered 03/10/2019  . Supervision of high risk pregnancy in third trimester 03/09/2019  . Marginal insertion of umbilical cord affecting management of mother 11/08/2018  . Previous cesarean delivery affecting pregnancy 08/31/2018  . Supervision of other normal pregnancy, antepartum 08/30/2018  . Migraine 04/14/2015  . Shingles 03/01/2014  . Generalized anxiety disorder 03/01/2014  . History of positive PPD 07/02/2013  . History of cervical dysplasia  06/22/2013   PHYSICAL THERAPY DISCHARGE SUMMARY  Visits from Start of Care: 5  Current functional level related to goals / functional outcomes: Pt continues with pain and radicular symptoms with mobility   Remaining deficits: See above   Education / Equipment: HEP  Plan: Patient agrees to discharge.  Patient goals were not met. Patient is being discharged due to not returning since the last visit.  ?????     KIsabelle Course PT,DPT03/23/222:48 PM  JKerin Perna PTA 06/23/20 12:05 PM  SJeral Pinch PT 06/23/20 12:12 PM   [place] [address]Phone: [phone]  Fax:  [phone] Name: JMirabelle CyphersMRN: [id number] of Birth: [birth year]

## 2020-06-24 ENCOUNTER — Ambulatory Visit (INDEPENDENT_AMBULATORY_CARE_PROVIDER_SITE_OTHER): Payer: BC Managed Care – PPO | Admitting: Sports Medicine

## 2020-06-24 DIAGNOSIS — M5416 Radiculopathy, lumbar region: Secondary | ICD-10-CM

## 2020-06-24 MED ORDER — PREGABALIN 50 MG PO CAPS
ORAL_CAPSULE | ORAL | 2 refills | Status: DC
Start: 2020-06-24 — End: 2020-06-25

## 2020-06-24 MED ORDER — DICLOFENAC SODIUM 75 MG PO TBEC
75.0000 mg | DELAYED_RELEASE_TABLET | Freq: Two times a day (BID) | ORAL | 3 refills | Status: DC
Start: 2020-06-24 — End: 2020-08-25

## 2020-06-24 NOTE — Assessment & Plan Note (Signed)
This is a very pleasant 35 year old female, severe left-sided S1 radiculopathy. No warning symptoms, her axial component was worse with sitting, flexion, Valsalva. Unfortunately she has not improved with greater than 6 weeks of physical therapy, steroids, Toradol, Solu-Medrol, ibuprofen. We have needed some hydrocodone. At this point we are to proceed with MRI for epidural planning. Adding Lyrica in the meantime.   Switching to Voltaren.

## 2020-06-24 NOTE — Progress Notes (Signed)
    Procedures performed today:    None.  Independent interpretation of notes and tests performed by another provider:   None.  Brief History, Exam, Impression, and Recommendations:    Left lumbar radiculitis This is a very pleasant 35 year old female, severe left-sided S1 radiculopathy. No warning symptoms, her axial component was worse with sitting, flexion, Valsalva. Unfortunately she has not improved with greater than 6 weeks of physical therapy, steroids, Toradol, Solu-Medrol, ibuprofen. We have needed some hydrocodone. At this point we are to proceed with MRI for epidural planning. Adding Lyrica in the meantime.   Switching to Voltaren.    ___________________________________________ [name]. Benjamin Stain, M.D., ABFM., CAQSM. Primary Care and Sports Medicine [facility]  Adjunct Instructor of Family Medicine  [employer]

## 2020-06-25 DIAGNOSIS — M5416 Radiculopathy, lumbar region: Secondary | ICD-10-CM

## 2020-06-25 MED ORDER — PREGABALIN 50 MG PO CAPS: ORAL_CAPSULE | ORAL | 2 refills | Status: DC

## 2020-06-30 ENCOUNTER — Other Ambulatory Visit: Payer: Self-pay

## 2020-06-30 ENCOUNTER — Ambulatory Visit (INDEPENDENT_AMBULATORY_CARE_PROVIDER_SITE_OTHER): Payer: BC Managed Care – PPO

## 2020-06-30 DIAGNOSIS — M5136 Other intervertebral disc degeneration, lumbar region: Secondary | ICD-10-CM | POA: Diagnosis not present

## 2020-06-30 DIAGNOSIS — M5126 Other intervertebral disc displacement, lumbar region: Secondary | ICD-10-CM | POA: Diagnosis not present

## 2020-06-30 DIAGNOSIS — M545 Low back pain, unspecified: Secondary | ICD-10-CM | POA: Diagnosis not present

## 2020-06-30 DIAGNOSIS — M48061 Spinal stenosis, lumbar region without neurogenic claudication: Secondary | ICD-10-CM | POA: Diagnosis not present

## 2020-07-08 ENCOUNTER — Telehealth: Payer: Self-pay | Admitting: *Deleted

## 2020-07-08 NOTE — Telephone Encounter (Signed)
Left patient a message to call and schedule annual after 08/06/2020.

## 2020-08-25 ENCOUNTER — Ambulatory Visit (INDEPENDENT_AMBULATORY_CARE_PROVIDER_SITE_OTHER): Payer: BC Managed Care – PPO | Admitting: Obstetrics and Gynecology

## 2020-08-25 ENCOUNTER — Other Ambulatory Visit (HOSPITAL_COMMUNITY)
Admission: RE | Admit: 2020-08-25 | Discharge: 2020-08-25 | Disposition: A | Discharge status: Home / Self Care | Payer: BC Managed Care – PPO | Source: Ambulatory Visit | Attending: Obstetrics and Gynecology | Admitting: Obstetrics and Gynecology

## 2020-08-25 ENCOUNTER — Encounter: Payer: Self-pay | Admitting: Obstetrics and Gynecology

## 2020-08-25 ENCOUNTER — Other Ambulatory Visit: Payer: Self-pay

## 2020-08-25 VITALS — BP 98/65 | HR 86 | Wt 173.0 lb

## 2020-08-25 DIAGNOSIS — O09521 Supervision of elderly multigravida, first trimester: Secondary | ICD-10-CM | POA: Diagnosis not present

## 2020-08-25 DIAGNOSIS — Z98891 History of uterine scar from previous surgery: Secondary | ICD-10-CM

## 2020-08-25 DIAGNOSIS — Z3480 Encounter for supervision of other normal pregnancy, unspecified trimester: Secondary | ICD-10-CM | POA: Diagnosis not present

## 2020-08-25 DIAGNOSIS — O09529 Supervision of elderly multigravida, unspecified trimester: Secondary | ICD-10-CM | POA: Insufficient documentation

## 2020-08-25 DIAGNOSIS — O34219 Maternal care for unspecified type scar from previous cesarean delivery: Secondary | ICD-10-CM

## 2020-08-25 MED ORDER — ONDANSETRON HCL 4 MG PO TABS: 4.0000 mg | ORAL_TABLET | Freq: Three times a day (TID) | ORAL | 1 refills | Status: DC | PRN

## 2020-08-25 MED ORDER — ASPIRIN EC 81 MG PO TBEC: 81.0000 mg | DELAYED_RELEASE_TABLET | Freq: Every day | ORAL | 2 refills | Status: DC

## 2020-08-25 NOTE — Patient Instructions (Signed)
Obstetrics: Normal and Problem Pregnancies (7th ed., pp. 102-121). [place], [place]: Elsevier."> Textbook of Family Medicine (9th ed., pp. 365-410). [place], [place]: Elsevier [employer].">  First Trimester of Pregnancy  The first trimester of pregnancy starts on the first day of your last menstrual period until the end of week 12. This is months 1 through 3 of pregnancy. A week after a sperm fertilizes an egg, the egg will implant into the wall of the uterus and begin to develop into a baby. By the end of 12 weeks, all the baby's organs will be formed and the baby will be 2-3 inches in size. Body changes during your first trimester Your body goes through many changes during pregnancy. The changes vary and generally return to normal after your baby is born. Physical changes  You may gain or lose weight.  Your breasts may begin to grow larger and become tender. The tissue that surrounds your nipples (areola) may become darker.  Dark spots or blotches (chloasma or mask of pregnancy) may develop on your face.  You may have changes in your hair. These can include thickening or thinning of your hair or changes in texture. Health changes  You may feel nauseous, and you may vomit.  You may have heartburn.  You may develop headaches.  You may develop constipation.  Your gums may bleed and may be sensitive to brushing and flossing. Other changes  You may tire easily.  You may urinate more often.  Your menstrual periods will stop.  You may have a loss of appetite.  You may develop cravings for certain kinds of food.  You may have changes in your emotions from day to day.  You may have more vivid and strange dreams. Follow these instructions at home: Medicines  Follow your health care provider's instructions regarding medicine use. Specific medicines may be either safe or unsafe to take during pregnancy. Do not take any medicines unless told to by your health care provider.  Take a  prenatal vitamin that contains at least 600 micrograms (mcg) of folic acid. Eating and drinking  Eat a healthy diet that includes fresh fruits and vegetables, whole grains, good sources of protein such as meat, eggs, or tofu, and low-fat dairy products.  Avoid raw meat and unpasteurized juice, milk, and cheese. These carry germs that can harm you and your baby.  If you feel nauseous or you vomit: ? Eat 4 or 5 small meals a day instead of 3 large meals. ? Try eating a few soda crackers. ? Drink liquids between meals instead of during meals.  You may need to take these actions to prevent or treat constipation: ? Drink enough fluid to keep your urine pale yellow. ? Eat foods that are high in fiber, such as beans, whole grains, and fresh fruits and vegetables. ? Limit foods that are high in fat and processed sugars, such as fried or sweet foods. Activity  Exercise only as directed by your health care provider. Most people can continue their usual exercise routine during pregnancy. Try to exercise for 30 minutes at least 5 days a week.  Stop exercising if you develop pain or cramping in the lower abdomen or lower back.  Avoid exercising if it is very hot or humid or if you are at high altitude.  Avoid heavy lifting.  If you choose to, you may have sex unless your health care provider tells you not to. Relieving pain and discomfort  Wear a good support bra to relieve breast   tenderness.  Rest with your legs elevated if you have leg cramps or low back pain.  If you develop bulging veins (varicose veins) in your legs: ? Wear support hose as told by your health care provider. ? Elevate your feet for 15 minutes, 3-4 times a day. ? Limit salt in your diet. Safety  Wear your seat belt at all times when driving or riding in a car.  Talk with your health care provider if someone is verbally or physically abusive to you.  Talk with your health care provider if you are feeling sad or have  thoughts of hurting yourself. Lifestyle  Do not use hot tubs, steam rooms, or saunas.  Do not douche. Do not use tampons or scented sanitary pads.  Do not use herbal remedies, alcohol, illegal drugs, or medicines that are not approved by your health care provider. Chemicals in these products can harm your baby.  Do not use any products that contain nicotine or tobacco, such as cigarettes, e-cigarettes, and chewing tobacco. If you need help quitting, ask your health care provider.  Avoid cat litter boxes and soil used by cats. These carry germs that can cause birth defects in the baby and possibly loss of the unborn baby (fetus) by miscarriage or stillbirth. General instructions  During routine prenatal visits in the first trimester, your health care provider will do a physical exam, perform necessary tests, and ask you how things are going. Keep all follow-up visits. This is important.  Ask for help if you have counseling or nutritional needs during pregnancy. Your health care provider can offer advice or refer you to specialists for help with various needs.  Schedule a dentist appointment. At home, brush your teeth with a soft toothbrush. Floss gently.  Write down your questions. Take them to your prenatal visits. Where to find more information  American Pregnancy Association: americanpregnancy.org  American College of Obstetricians and Gynecologists: acog.org/en/Womens%20Health/Pregnancy  Office on Women's Health: womenshealth.gov/pregnancy Contact a health care provider if you have:  Dizziness.  A fever.  Mild pelvic cramps, pelvic pressure, or nagging pain in the abdominal area.  Nausea, vomiting, or diarrhea that lasts for 24 hours or longer.  A bad-smelling vaginal discharge.  Pain when you urinate.  Known exposure to a contagious illness, such as chickenpox, measles, Zika virus, HIV, or hepatitis. Get help right away if you have:  Spotting or bleeding from your  vagina.  Severe abdominal cramping or pain.  Shortness of breath or chest pain.  Any kind of trauma, such as from a fall or a car crash.  New or increased pain, swelling, or redness in an arm or leg. Summary  The first trimester of pregnancy starts on the first day of your last menstrual period until the end of week 12 (months 1 through 3).  Eating 4 or 5 small meals a day rather than 3 large meals may help to relieve nausea and vomiting.  Do not use any products that contain nicotine or tobacco, such as cigarettes, e-cigarettes, and chewing tobacco. If you need help quitting, ask your health care provider.  Keep all follow-up visits. This is important. This information is not intended to replace advice given to you by your health care provider. Make sure you discuss any questions you have with your health care provider. Document Revised: 10/03/2019 Document Reviewed: 08/09/2019 Elsevier Patient Education  2021 Elsevier Inc.  

## 2020-08-25 NOTE — Progress Notes (Signed)
Would like something for nausea.Bedside U/S shows single IUP with FHT of 151 BPM and CRL is 37.34mm  GA [redacted]w[redacted]d.  Pt has had H/O abnormal pap

## 2020-08-25 NOTE — Progress Notes (Signed)
Subjective:  Mary Mcpherson is a 35 y.o. G4P3003 at [redacted]w[redacted]d being seen today for first OB visit. EDD by LMP and confirmed by first trimester U/S. H/O C section x 2, followed by VBAC. H/O CKC 15 yrs ago. Denies any chronic medical problems or medications.   She is currently monitored for the following issues for this high-risk pregnancy and has History of cervical dysplasia; History of positive PPD; Generalized anxiety disorder; Migraine; Left lumbar radiculitis; History of VBAC; AMA (advanced maternal age) multigravida 35+; and Supervision of other normal pregnancy, antepartum on their problem list.  Patient reports nausea.   .  .   . Denies leaking of fluid.   The following portions of the patient's history were reviewed and updated as appropriate: allergies, current medications, past family history, past medical history, past social history, past surgical history and problem list. Problem list updated.  Objective:   Vitals:   08/25/20 0820  BP: 98/65  Pulse: 86  Weight: 173 lb (78.5 kg)    Fetal Status:           General:  Alert, oriented and cooperative. Patient is in no acute distress.  Skin: Skin is warm and dry. No rash noted.   Cardiovascular: Normal heart rate noted  Respiratory: Normal respiratory effort, no problems with respiration noted  Abdomen: Soft, gravid, appropriate for gestational age.       Pelvic:  Cervical exam performed        Extremities: Normal range of motion.     Mental Status: Normal mood and affect. Normal behavior. Normal judgment and thought content.   Urinalysis:      Assessment and Plan:  Pregnancy: G4P3003 at [redacted]w[redacted]d  1. Previous cesarean delivery affecting pregnancy VBAC with last preg 02/2019  2. History of VBAC Desires again  3. Multigravida of advanced maternal age in first trimester Genetic testing discussed BASA qd, indications reviewed  4. Supervision of other normal pregnancy, antepartum Prenatal care and labs reviewed with pt Genetic  testing discussed - Obstetric panel - Hepatitis C Antibody - HIV antibody (with reflex) - Hemoglobinopathy Evaluation - Culture, OB Urine - GC/Chlamydia probe amp ([place])not at [facility] - Babyscripts Schedule Optimization - [place] bedside; Future  Preterm labor symptoms and general obstetric precautions including but not limited to vaginal bleeding, contractions, leaking of fluid and fetal movement were reviewed in detail with the patient. Please refer to After Visit Summary for other counseling recommendations.  Return in about 4 weeks (around 09/22/2020) for OB visit, face to face, any provider.   Hermina Staggers, MD

## 2020-08-26 LAB — HEMOGLOBIN A1C
Hgb A1c MFr Bld: 4.8 % of total Hgb (ref <5.7)
Mean Plasma Glucose: 91 mg/dL
eAG (mmol/L): 5 mmol/L

## 2020-08-26 LAB — GC/CHLAMYDIA PROBE AMP (~~LOC~~) NOT AT ARMC
Chlamydia: NEGATIVE
Comment: NEGATIVE
Comment: NORMAL
Neisseria Gonorrhea: NEGATIVE

## 2020-08-27 LAB — OBSTETRIC PANEL
Absolute Monocytes: 392 cells/uL (ref 200–950)
Antibody Screen: NOT DETECTED
Basophils Absolute: 11 cells/uL (ref 0–200)
Basophils Relative: 0.3 %
Eosinophils Absolute: 11 cells/uL — ABNORMAL LOW (ref 15–500)
Eosinophils Relative: 0.3 %
HCT: 39.3 % (ref 35.0–45.0)
Hemoglobin: 12.9 g/dL (ref 11.7–15.5)
Hepatitis B Surface Ag: NONREACTIVE
Lymphs Abs: 781 cells/uL — ABNORMAL LOW (ref 850–3900)
MCH: 30.6 pg (ref 27.0–33.0)
MCHC: 32.8 g/dL (ref 32.0–36.0)
MCV: 93.1 fL (ref 80.0–100.0)
MPV: 10.5 fL (ref 7.5–12.5)
Monocytes Relative: 10.9 %
Neutro Abs: 2405 cells/uL (ref 1500–7800)
Neutrophils Relative %: 66.8 %
Platelets: 271 10*3/uL (ref 140–400)
RBC: 4.22 10*6/uL (ref 3.80–5.10)
RDW: 12.4 % (ref 11.0–15.0)
RPR Ser Ql: NONREACTIVE
Rubella: 1.27 Index
Total Lymphocyte: 21.7 %
WBC: 3.6 10*3/uL — ABNORMAL LOW (ref 3.8–10.8)

## 2020-08-27 LAB — HEMOGLOBINOPATHY EVALUATION
Fetal Hemoglobin Testing: <1 % (ref 0.0–1.9)
HCT: 37.9 % (ref 35.0–45.0)
Hemoglobin A2 - HGBRFX: 2.7 % (ref 2.2–3.2)
Hemoglobin: 13 g/dL (ref 11.7–15.5)
Hgb A: 97.3 % (ref >96.0)
MCH: 32.3 pg (ref 27.0–33.0)
MCV: 94 fL (ref 80.0–100.0)
RBC: 4.03 10*6/uL (ref 3.80–5.10)
RDW: 12.6 % (ref 11.0–15.0)

## 2020-08-27 LAB — URINE CULTURE, OB REFLEX

## 2020-08-27 LAB — HEPATITIS C ANTIBODY
Hepatitis C Ab: NONREACTIVE
SIGNAL TO CUT-OFF: 0 (ref <1.00)

## 2020-08-27 LAB — HIV ANTIBODY (ROUTINE TESTING W REFLEX): HIV 1&2 Ab, 4th Generation: NONREACTIVE

## 2020-08-27 LAB — CULTURE, OB URINE

## 2020-09-09 ENCOUNTER — Ambulatory Visit (INDEPENDENT_AMBULATORY_CARE_PROVIDER_SITE_OTHER): Payer: BC Managed Care – PPO | Admitting: *Deleted

## 2020-09-09 ENCOUNTER — Other Ambulatory Visit: Payer: Self-pay

## 2020-09-09 DIAGNOSIS — Z36 Encounter for antenatal screening for chromosomal anomalies: Secondary | ICD-10-CM | POA: Diagnosis not present

## 2020-09-09 DIAGNOSIS — Z31430 Encounter of female for testing for genetic disease carrier status for procreative management: Secondary | ICD-10-CM | POA: Diagnosis not present

## 2020-09-09 DIAGNOSIS — Z3A13 13 weeks gestation of pregnancy: Secondary | ICD-10-CM

## 2020-09-09 DIAGNOSIS — Z3480 Encounter for supervision of other normal pregnancy, unspecified trimester: Secondary | ICD-10-CM

## 2020-09-09 DIAGNOSIS — Z3482 Encounter for supervision of other normal pregnancy, second trimester: Secondary | ICD-10-CM | POA: Diagnosis not present

## 2020-09-09 NOTE — Progress Notes (Signed)
Pt here for lab only for NIPS

## 2020-09-17 DIAGNOSIS — Z3480 Encounter for supervision of other normal pregnancy, unspecified trimester: Secondary | ICD-10-CM

## 2020-09-18 DIAGNOSIS — Z3480 Encounter for supervision of other normal pregnancy, unspecified trimester: Secondary | ICD-10-CM

## 2020-09-23 ENCOUNTER — Ambulatory Visit (INDEPENDENT_AMBULATORY_CARE_PROVIDER_SITE_OTHER): Payer: BC Managed Care – PPO | Admitting: Advanced Practice Midwife

## 2020-09-23 ENCOUNTER — Other Ambulatory Visit: Payer: Self-pay

## 2020-09-23 VITALS — BP 117/74 | HR 91 | Wt 176.0 lb

## 2020-09-23 DIAGNOSIS — M5126 Other intervertebral disc displacement, lumbar region: Secondary | ICD-10-CM

## 2020-09-23 DIAGNOSIS — Z98891 History of uterine scar from previous surgery: Secondary | ICD-10-CM

## 2020-09-23 DIAGNOSIS — Z3A14 14 weeks gestation of pregnancy: Secondary | ICD-10-CM

## 2020-09-23 DIAGNOSIS — Z3480 Encounter for supervision of other normal pregnancy, unspecified trimester: Secondary | ICD-10-CM

## 2020-09-23 DIAGNOSIS — Z3A19 19 weeks gestation of pregnancy: Secondary | ICD-10-CM

## 2020-09-23 NOTE — Progress Notes (Signed)
   PRENATAL VISIT NOTE  Subjective:  Mary Mcpherson is a 35 y.o. G4P3003 at [redacted]w[redacted]d being seen today for ongoing prenatal care.  She is currently monitored for the following issues for this low-risk pregnancy and has History of cervical dysplasia; History of positive PPD; Generalized anxiety disorder; Migraine; Left lumbar radiculitis; History of VBAC; AMA (advanced maternal age) multigravida 35+; and Supervision of other normal pregnancy, antepartum on their problem list.  Patient reports no complaints.  Contractions: Not present. Vag. Bleeding: None.  Movement: Absent. Denies leaking of fluid.   The following portions of the patient's history were reviewed and updated as appropriate: allergies, current medications, past family history, past medical history, past social history, past surgical history and problem list.   Objective:   Vitals:   09/23/20 1551  BP: 117/74  Pulse: 91  Weight: 176 lb (79.8 kg)    Fetal Status: Fetal Heart Rate (bpm): 155   Movement: Absent     General:  Alert, oriented and cooperative. Patient is in no acute distress.  Skin: Skin is warm and dry. No rash noted.   Cardiovascular: Normal heart rate noted  Respiratory: Normal respiratory effort, no problems with respiration noted  Abdomen: Soft, gravid, appropriate for gestational age.  Pain/Pressure: Absent     Pelvic: Cervical exam deferred        Extremities: Normal range of motion.  Edema: None  Mental Status: Normal mood and affect. Normal behavior. Normal judgment and thought content.   Assessment and Plan:  Pregnancy: G4P3003 at [redacted]w[redacted]d 1. Supervision of other normal pregnancy, antepartum --Anticipatory guidance about next visits/weeks of pregnancy given. --Next appt in 4 weeks  - [place] MFM OB COMP + 14 WK; Future  2. [redacted] weeks gestation of pregnancy  3. Herniated intervertebral disc of lumbar spine --Tx with PT in the past without success, improved now in early pregnancy with less numbness/tingling of  right leg --Ice preferred over heat, Tylenol PRN, use pregnancy support belt as pregnancy progresses, consider PT in pregnancy  4. History of VBAC --C/S x 2 then successful VBAC 2020. Pt desires VBAC this pregnancy.  Preterm labor symptoms and general obstetric precautions including but not limited to vaginal bleeding, contractions, leaking of fluid and fetal movement were reviewed in detail with the patient. Please refer to After Visit Summary for other counseling recommendations.   Return in about 4 weeks (around 10/21/2020).  Future Appointments  Date Time Provider Department Center  10/21/2020  8:30 AM Rasch, Harolyn Rutherford, NP [facility]    Sharen Counter, CNM

## 2020-10-21 ENCOUNTER — Ambulatory Visit (INDEPENDENT_AMBULATORY_CARE_PROVIDER_SITE_OTHER): Payer: BC Managed Care – PPO | Admitting: Obstetrics and Gynecology

## 2020-10-21 ENCOUNTER — Other Ambulatory Visit: Payer: Self-pay

## 2020-10-21 VITALS — BP 100/67 | HR 88 | Wt 178.0 lb

## 2020-10-21 DIAGNOSIS — Z3480 Encounter for supervision of other normal pregnancy, unspecified trimester: Secondary | ICD-10-CM

## 2020-10-21 NOTE — Progress Notes (Signed)
   PRENATAL VISIT NOTE  Subjective:  Mary Mcpherson is a 35 y.o. G4P3003 at [redacted]w[redacted]d being seen today for ongoing prenatal care.  She is currently monitored for the following issues for this low-risk pregnancy and has History of cervical dysplasia; History of positive PPD; Generalized anxiety disorder; Migraine; Left lumbar radiculitis; History of VBAC; AMA (advanced maternal age) multigravida 35+; and Supervision of other normal pregnancy, antepartum on their problem list.  Patient reports no complaints.  Contractions: Not present. Vag. Bleeding: None.  Movement: Present. Denies leaking of fluid.   The following portions of the patient's history were reviewed and updated as appropriate: allergies, current medications, past family history, past medical history, past social history, past surgical history and problem list.   Objective:   Vitals:   10/21/20 0843  BP: 100/67  Pulse: 88  Weight: 178 lb (80.7 kg)    Fetal Status: Fetal Heart Rate (bpm): 159   Movement: Present     General:  Alert, oriented and cooperative. Patient is in no acute distress.  Skin: Skin is warm and dry. No rash noted.   Cardiovascular: Normal heart rate noted  Respiratory: Normal respiratory effort, no problems with respiration noted  Abdomen: Soft, gravid, appropriate for gestational age.  Pain/Pressure: Absent     Pelvic: Cervical exam deferred         Extremities: Normal range of motion.  Edema: None  Mental Status: Normal mood and affect. Normal behavior. Normal judgment and thought content.   Assessment and Plan:  Pregnancy: G4P3003 at [redacted]w[redacted]d  1. Supervision of other normal pregnancy, antepartum   Declined AFP Doing well.  Plans VBAC   Preterm labor symptoms and general obstetric precautions including but not limited to vaginal bleeding, contractions, leaking of fluid and fetal movement were reviewed in detail with the patient. Please refer to After Visit Summary for other counseling recommendations.    No follow-ups on file.  Future Appointments  Date Time Provider Department Center  10/27/2020 10:30 AM [facility]3 [facility]  11/18/2020  9:10 AM Leftwich-Kirby, Wilmer Floor, CNM [facility]    Venia Carbon, NP

## 2020-10-27 ENCOUNTER — Encounter: Payer: Self-pay | Admitting: Advanced Practice Midwife

## 2020-10-27 ENCOUNTER — Ambulatory Visit: Payer: BC Managed Care – PPO | Attending: Advanced Practice Midwife

## 2020-10-27 ENCOUNTER — Other Ambulatory Visit: Payer: Self-pay | Admitting: *Deleted

## 2020-10-27 ENCOUNTER — Other Ambulatory Visit: Payer: Self-pay | Admitting: Advanced Practice Midwife

## 2020-10-27 ENCOUNTER — Other Ambulatory Visit: Payer: Self-pay

## 2020-10-27 DIAGNOSIS — Z3A19 19 weeks gestation of pregnancy: Secondary | ICD-10-CM

## 2020-10-27 DIAGNOSIS — Z3480 Encounter for supervision of other normal pregnancy, unspecified trimester: Secondary | ICD-10-CM | POA: Insufficient documentation

## 2020-10-27 DIAGNOSIS — O4402 Placenta previa specified as without hemorrhage, second trimester: Secondary | ICD-10-CM | POA: Insufficient documentation

## 2020-10-27 DIAGNOSIS — O4400 Placenta previa specified as without hemorrhage, unspecified trimester: Secondary | ICD-10-CM

## 2020-10-27 DIAGNOSIS — O4440 Low lying placenta NOS or without hemorrhage, unspecified trimester: Secondary | ICD-10-CM | POA: Insufficient documentation

## 2020-11-18 ENCOUNTER — Other Ambulatory Visit: Payer: Self-pay

## 2020-11-18 ENCOUNTER — Ambulatory Visit (INDEPENDENT_AMBULATORY_CARE_PROVIDER_SITE_OTHER): Payer: BC Managed Care – PPO | Admitting: Advanced Practice Midwife

## 2020-11-18 VITALS — BP 106/66 | HR 80 | Wt 180.0 lb

## 2020-11-18 DIAGNOSIS — Z98891 History of uterine scar from previous surgery: Secondary | ICD-10-CM

## 2020-11-18 DIAGNOSIS — Z3A22 22 weeks gestation of pregnancy: Secondary | ICD-10-CM

## 2020-11-18 DIAGNOSIS — Z3480 Encounter for supervision of other normal pregnancy, unspecified trimester: Secondary | ICD-10-CM

## 2020-11-18 NOTE — Progress Notes (Signed)
   PRENATAL VISIT NOTE  Subjective:  Mary Mcpherson is a 35 y.o. G4P3003 at [redacted]w[redacted]d being seen today for ongoing prenatal care.  She is currently monitored for the following issues for this low-risk pregnancy and has History of cervical dysplasia; History of positive PPD; Generalized anxiety disorder; Migraine; Left lumbar radiculitis; History of VBAC; AMA (advanced maternal age) multigravida 35+; Supervision of other normal pregnancy, antepartum; and Placenta previa antepartum in second trimester on their problem list.  Patient reports no complaints.  Contractions: Regular. Vag. Bleeding: None.  Movement: Present. Denies leaking of fluid.   The following portions of the patient's history were reviewed and updated as appropriate: allergies, current medications, past family history, past medical history, past social history, past surgical history and problem list.   Objective:   Vitals:   11/18/20 0910  BP: 106/66  Pulse: 80  Weight: 180 lb (81.6 kg)    Fetal Status: Fetal Heart Rate (bpm): 157   Movement: Present     General:  Alert, oriented and cooperative. Patient is in no acute distress.  Skin: Skin is warm and dry. No rash noted.   Cardiovascular: Normal heart rate noted  Respiratory: Normal respiratory effort, no problems with respiration noted  Abdomen: Soft, gravid, appropriate for gestational age.  Pain/Pressure: Absent     Pelvic: Cervical exam deferred        Extremities: Normal range of motion.  Edema: None  Mental Status: Normal mood and affect. Normal behavior. Normal judgment and thought content.   Assessment and Plan:  Pregnancy: G4P3003 at [redacted]w[redacted]d 1. Supervision of other normal pregnancy, antepartum --Anticipatory guidance about next visits/weeks of pregnancy given. --Babyscripts optimization schedule.  Pt to return in 5 weeks at 27 weeks for GTT.  2. History of VBAC --C/S x 2 then successful VBAC, desires VBAC.   3. [redacted] weeks gestation of pregnancy   Preterm  labor symptoms and general obstetric precautions including but not limited to vaginal bleeding, contractions, leaking of fluid and fetal movement were reviewed in detail with the patient. Please refer to After Visit Summary for other counseling recommendations.   No follow-ups on file.  Future Appointments  Date Time Provider Department Center  12/05/2020  8:30 AM [facility]URSE [facility]  12/05/2020  8:45 AM [facility]4 [facility]    Sharen Counter, CNM

## 2020-12-05 ENCOUNTER — Other Ambulatory Visit: Payer: Self-pay

## 2020-12-05 ENCOUNTER — Ambulatory Visit: Payer: BC Managed Care – PPO | Admitting: *Deleted

## 2020-12-05 ENCOUNTER — Ambulatory Visit: Payer: BC Managed Care – PPO | Attending: Obstetrics and Gynecology

## 2020-12-05 ENCOUNTER — Other Ambulatory Visit: Payer: Self-pay | Admitting: *Deleted

## 2020-12-05 VITALS — BP 114/68 | HR 84

## 2020-12-05 DIAGNOSIS — O09522 Supervision of elderly multigravida, second trimester: Secondary | ICD-10-CM | POA: Diagnosis not present

## 2020-12-05 DIAGNOSIS — O34219 Maternal care for unspecified type scar from previous cesarean delivery: Secondary | ICD-10-CM

## 2020-12-05 DIAGNOSIS — Z3A24 24 weeks gestation of pregnancy: Secondary | ICD-10-CM | POA: Diagnosis not present

## 2020-12-05 DIAGNOSIS — O4402 Placenta previa specified as without hemorrhage, second trimester: Secondary | ICD-10-CM

## 2020-12-05 DIAGNOSIS — O4400 Placenta previa specified as without hemorrhage, unspecified trimester: Secondary | ICD-10-CM | POA: Insufficient documentation

## 2020-12-05 DIAGNOSIS — Z3480 Encounter for supervision of other normal pregnancy, unspecified trimester: Secondary | ICD-10-CM | POA: Insufficient documentation

## 2020-12-23 ENCOUNTER — Ambulatory Visit (INDEPENDENT_AMBULATORY_CARE_PROVIDER_SITE_OTHER): Payer: BC Managed Care – PPO

## 2020-12-23 ENCOUNTER — Other Ambulatory Visit: Payer: Self-pay

## 2020-12-23 VITALS — BP 107/58 | HR 86 | Wt 180.0 lb

## 2020-12-23 DIAGNOSIS — Z3480 Encounter for supervision of other normal pregnancy, unspecified trimester: Secondary | ICD-10-CM | POA: Diagnosis not present

## 2020-12-23 DIAGNOSIS — O4402 Placenta previa specified as without hemorrhage, second trimester: Secondary | ICD-10-CM

## 2020-12-23 DIAGNOSIS — Z3A27 27 weeks gestation of pregnancy: Secondary | ICD-10-CM

## 2020-12-23 DIAGNOSIS — Z98891 History of uterine scar from previous surgery: Secondary | ICD-10-CM

## 2020-12-23 NOTE — Progress Notes (Signed)
   PRENATAL VISIT NOTE  Subjective:  Mary Mcpherson is a 35 y.o. G4P3003 at [redacted]w[redacted]d being seen today for ongoing prenatal care.  She is currently monitored for the following issues for this high-risk pregnancy and has History of cervical dysplasia; History of positive PPD; Generalized anxiety disorder; Migraine; Left lumbar radiculitis; History of VBAC; AMA (advanced maternal age) multigravida 35+; Supervision of other normal pregnancy, antepartum; and Placenta previa antepartum in second trimester on their problem list.  Patient reports no complaints.  Contractions: Not present.  .  Movement: Present. Denies leaking of fluid.   The following portions of the patient's history were reviewed and updated as appropriate: allergies, current medications, past family history, past medical history, past social history, past surgical history and problem list.   Objective:   Vitals:   12/23/20 0930  BP: (!) 107/58  Pulse: 86  Weight: 180 lb (81.6 kg)    Fetal Status: Fetal Heart Rate (bpm): 145 Fundal Height: 26 cm Movement: Present     General:  Alert, oriented and cooperative. Patient is in no acute distress.  Skin: Skin is warm and dry. No rash noted.   Cardiovascular: Normal heart rate noted  Respiratory: Normal respiratory effort, no problems with respiration noted  Abdomen: Soft, gravid, appropriate for gestational age.  Pain/Pressure: Absent     Pelvic: Cervical exam deferred        Extremities: Normal range of motion.  Edema: None  Mental Status: Normal mood and affect. Normal behavior. Normal judgment and thought content.   Assessment and Plan:  Pregnancy: G4P3003 at [redacted]w[redacted]d 1. Supervision of other normal pregnancy, antepartum - Routine OB care. No concerns today - Active fetal movement. BP normotensive  - Glucose Tolerance, 2 Hours w/1 Hour - CBC - RPR - HIV antibody (with reflex)  2. [redacted] weeks gestation of pregnancy   3. Placenta previa antepartum in second trimester -  Posterior placenta previa 1.93cm from os on 7/29 - Follow up ultrasound scheduled on 9/8  4. History of VBAC - Previous c/s x2 with 1 succesful VBAC. Desires another VBAC, however is aware that given current previa may need another c/s if not resolved.    Preterm labor symptoms and general obstetric precautions including but not limited to vaginal bleeding, contractions, leaking of fluid and fetal movement were reviewed in detail with the patient. Please refer to After Visit Summary for other counseling recommendations.   Return in about 2 weeks (around 01/06/2021).  Future Appointments  Date Time Provider Department Center  01/08/2021  8:50 AM Milas Hock, MD [facility]  01/15/2021  8:30 AM [facility] NURSE [facility]  01/15/2021  8:45 AM [facility]4 [facility]     Brand Males, CNM 12/23/20 11:24 AM

## 2020-12-24 LAB — CBC
HCT: 34.2 % — ABNORMAL LOW (ref 35.0–45.0)
Hemoglobin: 11.5 g/dL — ABNORMAL LOW (ref 11.7–15.5)
MCH: 31.3 pg (ref 27.0–33.0)
MCHC: 33.6 g/dL (ref 32.0–36.0)
MCV: 93.2 fL (ref 80.0–100.0)
MPV: 10.6 fL (ref 7.5–12.5)
Platelets: 324 10*3/uL (ref 140–400)
RBC: 3.67 10*6/uL — ABNORMAL LOW (ref 3.80–5.10)
RDW: 12.8 % (ref 11.0–15.0)
WBC: 6.2 10*3/uL (ref 3.8–10.8)

## 2020-12-24 LAB — RPR: RPR Ser Ql: NONREACTIVE

## 2020-12-24 LAB — GLUCOSE TOLERANCE, 2 HOURS W/ 1HR
Glucose, 1 hour: 74 mg/dL (ref 65–199)
Glucose, 2 hour: 67 mg/dL (ref 65–139)
Glucose, Fasting: 65 mg/dL (ref 65–99)

## 2020-12-24 LAB — HIV ANTIBODY (ROUTINE TESTING W REFLEX): HIV 1&2 Ab, 4th Generation: NONREACTIVE

## 2021-01-08 ENCOUNTER — Ambulatory Visit (INDEPENDENT_AMBULATORY_CARE_PROVIDER_SITE_OTHER): Payer: BC Managed Care – PPO | Admitting: Obstetrics and Gynecology

## 2021-01-08 ENCOUNTER — Encounter: Payer: Self-pay | Admitting: Obstetrics and Gynecology

## 2021-01-08 ENCOUNTER — Other Ambulatory Visit: Payer: Self-pay

## 2021-01-08 VITALS — BP 114/64 | HR 91 | Wt 182.0 lb

## 2021-01-08 DIAGNOSIS — O4402 Placenta previa specified as without hemorrhage, second trimester: Secondary | ICD-10-CM

## 2021-01-08 DIAGNOSIS — Z98891 History of uterine scar from previous surgery: Secondary | ICD-10-CM

## 2021-01-08 DIAGNOSIS — Z3480 Encounter for supervision of other normal pregnancy, unspecified trimester: Secondary | ICD-10-CM

## 2021-01-08 DIAGNOSIS — Z23 Encounter for immunization: Secondary | ICD-10-CM
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
## 2021-01-08 NOTE — Addendum Note (Signed)
Addended by: Kathie Dike on: 01/08/2021 09:37 AM   Modules accepted: Orders

## 2021-01-15 ENCOUNTER — Ambulatory Visit: Payer: BC Managed Care – PPO | Attending: Obstetrics

## 2021-01-15 ENCOUNTER — Ambulatory Visit: Payer: BC Managed Care – PPO | Admitting: *Deleted

## 2021-01-15 ENCOUNTER — Other Ambulatory Visit: Payer: Self-pay | Admitting: *Deleted

## 2021-01-15 ENCOUNTER — Other Ambulatory Visit: Payer: Self-pay

## 2021-01-15 ENCOUNTER — Encounter: Payer: Self-pay | Admitting: *Deleted

## 2021-01-15 VITALS — BP 116/85 | HR 97

## 2021-01-15 DIAGNOSIS — Z362 Encounter for other antenatal screening follow-up: Secondary | ICD-10-CM | POA: Diagnosis not present

## 2021-01-15 DIAGNOSIS — O09523 Supervision of elderly multigravida, third trimester: Secondary | ICD-10-CM

## 2021-01-15 DIAGNOSIS — O34219 Maternal care for unspecified type scar from previous cesarean delivery: Secondary | ICD-10-CM

## 2021-01-15 DIAGNOSIS — O4440 Low lying placenta NOS or without hemorrhage, unspecified trimester: Secondary | ICD-10-CM

## 2021-01-15 DIAGNOSIS — O4403 Placenta previa specified as without hemorrhage, third trimester: Secondary | ICD-10-CM

## 2021-01-15 DIAGNOSIS — Z3480 Encounter for supervision of other normal pregnancy, unspecified trimester: Secondary | ICD-10-CM | POA: Diagnosis not present

## 2021-01-15 DIAGNOSIS — O4402 Placenta previa specified as without hemorrhage, second trimester: Secondary | ICD-10-CM

## 2021-01-15 DIAGNOSIS — Z3A30 30 weeks gestation of pregnancy: Secondary | ICD-10-CM | POA: Diagnosis not present

## 2021-01-21 NOTE — Progress Notes (Signed)
   PRENATAL VISIT NOTE  Subjective:  Mary Mcpherson is a 35 y.o. G4P3003 at [redacted]w[redacted]d being seen today for ongoing prenatal care.  She is currently monitored for the following issues for this high-risk pregnancy and has History of cervical dysplasia; History of positive PPD; Generalized anxiety disorder; Migraine; Left lumbar radiculitis; History of VBAC; AMA (advanced maternal age) multigravida 35+; Supervision of other normal pregnancy, antepartum; and Low lying placenta, antepartum on their problem list.  Patient reports no complaints.  Contractions: Not present. Vag. Bleeding: None.  Movement: Present. Denies leaking of fluid.   The following portions of the patient's history were reviewed and updated as appropriate: allergies, current medications, past family history, past medical history, past social history, past surgical history and problem list.   Objective:   Vitals:   01/22/21 0820  BP: 113/78  Pulse: 89  Weight: 184 lb (83.5 kg)    Fetal Status: Fetal Heart Rate (bpm): 136 Fundal Height: 32 cm Movement: Present     General:  Alert, oriented and cooperative. Patient is in no acute distress.  Skin: Skin is warm and dry. No rash noted.   Cardiovascular: Normal heart rate noted  Respiratory: Normal respiratory effort, no problems with respiration noted  Abdomen: Soft, gravid, appropriate for gestational age.  Pain/Pressure: Absent     Pelvic: Cervical exam deferred        Extremities: Normal range of motion.  Edema: Trace  Mental Status: Normal mood and affect. Normal behavior. Normal judgment and thought content.   Assessment and Plan:  Pregnancy: G4P3003 at [redacted]w[redacted]d 1. Low lying placenta, antepartum - No bleeding, continue pelvic rest at this point - 0 to 10 mm - vaginal delivery: 43 percen, emergency cesarean: 45 percent  11 to 20 mm - vaginal delivery: 85 percent, emergency cesarean: 14 percent >20 mm - vaginal delivery: 82 percent, emergency cesarean: 10 percent  - At this  time, she knows we would still recommend c-section since 37mm from os. We discussed if borderline, she would still prefer RLTCS as she would like to avoid emergency c-section. She had one with her first delivery and does not want to experience that today.    2. Supervision of other normal pregnancy, antepartum - Continue routine PP care - Rh positive  3. History of VBAC - Plan will be contingent on improvement on LLP and her comfort level with risk - Reviewed we would do VBAC consent only if distance from os improves  Preterm labor symptoms and general obstetric precautions including but not limited to vaginal bleeding, contractions, leaking of fluid and fetal movement were reviewed in detail with the patient. Please refer to After Visit Summary for other counseling recommendations.   Return in about 2 weeks (around 02/05/2021).  Future Appointments  Date Time Provider Department Center  02/12/2021  9:30 AM [facility] NURSE [facility]  02/12/2021  9:45 AM [facility]6 [facility]    Milas Hock, MD

## 2021-01-22 ENCOUNTER — Encounter: Payer: Self-pay | Admitting: Obstetrics and Gynecology

## 2021-01-22 ENCOUNTER — Ambulatory Visit (INDEPENDENT_AMBULATORY_CARE_PROVIDER_SITE_OTHER): Payer: BC Managed Care – PPO | Admitting: Obstetrics and Gynecology

## 2021-01-22 ENCOUNTER — Other Ambulatory Visit: Payer: Self-pay

## 2021-01-22 VITALS — BP 113/78 | HR 89 | Wt 184.0 lb

## 2021-01-22 DIAGNOSIS — Z3480 Encounter for supervision of other normal pregnancy, unspecified trimester: Secondary | ICD-10-CM

## 2021-01-22 DIAGNOSIS — Z98891 History of uterine scar from previous surgery: Secondary | ICD-10-CM

## 2021-01-22 DIAGNOSIS — O4440 Low lying placenta NOS or without hemorrhage, unspecified trimester: Secondary | ICD-10-CM

## 2021-02-03 ENCOUNTER — Other Ambulatory Visit: Payer: Self-pay

## 2021-02-03 ENCOUNTER — Ambulatory Visit (INDEPENDENT_AMBULATORY_CARE_PROVIDER_SITE_OTHER): Payer: BC Managed Care – PPO

## 2021-02-03 VITALS — BP 113/70 | HR 104 | Wt 186.0 lb

## 2021-02-03 DIAGNOSIS — O4440 Low lying placenta NOS or without hemorrhage, unspecified trimester: Secondary | ICD-10-CM

## 2021-02-03 DIAGNOSIS — Z98891 History of uterine scar from previous surgery: Secondary | ICD-10-CM

## 2021-02-03 DIAGNOSIS — Z3480 Encounter for supervision of other normal pregnancy, unspecified trimester: Secondary | ICD-10-CM

## 2021-02-03 NOTE — Progress Notes (Signed)
   PRENATAL VISIT NOTE  Subjective:  Mary Mcpherson is a 35 y.o. G4P3003 at [redacted]w[redacted]d being seen today for ongoing prenatal care.  She is currently monitored for the following issues for this high-risk pregnancy and has History of cervical dysplasia; History of positive PPD; Generalized anxiety disorder; Migraine; Left lumbar radiculitis; History of VBAC; AMA (advanced maternal age) multigravida 35+; Supervision of other normal pregnancy, antepartum; and Low lying placenta, antepartum on their problem list.  Patient reports no complaints.  Contractions: Not present. Vag. Bleeding: None.  Movement: Present. Denies leaking of fluid.   The following portions of the patient's history were reviewed and updated as appropriate: allergies, current medications, past family history, past medical history, past social history, past surgical history and problem list.   Objective:   Vitals:   02/03/21 0834  BP: 113/70  Pulse: (!) 104  Weight: 186 lb (84.4 kg)    Fetal Status: Fetal Heart Rate (bpm): 138 Fundal Height: 33 cm Movement: Present     General:  Alert, oriented and cooperative. Patient is in no acute distress.  Skin: Skin is warm and dry. No rash noted.   Cardiovascular: Normal heart rate noted  Respiratory: Normal respiratory effort, no problems with respiration noted  Abdomen: Soft, gravid, appropriate for gestational age.  Pain/Pressure: Absent     Pelvic: Cervical exam deferred        Extremities: Normal range of motion.  Edema: Trace  Mental Status: Normal mood and affect. Normal behavior. Normal judgment and thought content.   Assessment and Plan:  Pregnancy: G4P3003 at [redacted]w[redacted]d  1. Low lying placenta, antepartum - No bleeding, continue pelvic rest  2. Supervision of other normal pregnancy, antepartum - Routine OB care - Doing well, no concerns - Active fetal movement, BP normotensive  3. History of VBAC - Given low-lying placenta, patient feels at this point she would be more  comfortable proceeding with repeat c-section - Patient to see MD at next visit   Preterm labor symptoms and general obstetric precautions including but not limited to vaginal bleeding, contractions, leaking of fluid and fetal movement were reviewed in detail with the patient. Please refer to After Visit Summary for other counseling recommendations.   Return in about 2 weeks (around 02/17/2021).  Future Appointments  Date Time Provider Department Center  02/12/2021  9:30 AM [facility] NURSE [facility]  02/12/2021  9:45 AM [facility]  02/19/2021  9:50 AM Milas Hock, MD [facility]     Brand Males, CNM 02/03/21 9:06 AM

## 2021-02-12 ENCOUNTER — Ambulatory Visit: Payer: BC Managed Care – PPO | Attending: Maternal & Fetal Medicine

## 2021-02-12 ENCOUNTER — Other Ambulatory Visit: Payer: Self-pay

## 2021-02-12 ENCOUNTER — Encounter: Payer: Self-pay | Admitting: *Deleted

## 2021-02-12 ENCOUNTER — Ambulatory Visit: Payer: BC Managed Care – PPO | Admitting: *Deleted

## 2021-02-12 VITALS — BP 105/63 | HR 68

## 2021-02-12 DIAGNOSIS — Z3480 Encounter for supervision of other normal pregnancy, unspecified trimester: Secondary | ICD-10-CM

## 2021-02-12 DIAGNOSIS — O09523 Supervision of elderly multigravida, third trimester: Secondary | ICD-10-CM

## 2021-02-12 DIAGNOSIS — O4440 Low lying placenta NOS or without hemorrhage, unspecified trimester: Secondary | ICD-10-CM

## 2021-02-12 DIAGNOSIS — Z3A34 34 weeks gestation of pregnancy: Secondary | ICD-10-CM

## 2021-02-12 DIAGNOSIS — O34219 Maternal care for unspecified type scar from previous cesarean delivery: Secondary | ICD-10-CM | POA: Diagnosis not present

## 2021-02-12 DIAGNOSIS — O4443 Low lying placenta NOS or without hemorrhage, third trimester: Secondary | ICD-10-CM | POA: Diagnosis not present

## 2021-02-12 DIAGNOSIS — Z362 Encounter for other antenatal screening follow-up: Secondary | ICD-10-CM

## 2021-02-14 NOTE — Progress Notes (Signed)
   PRENATAL VISIT NOTE  Subjective:  Mary Mcpherson is a 35 y.o. G4P3003 at [redacted]w[redacted]d being seen today for ongoing prenatal care.  She is currently monitored for the following issues for this high-risk pregnancy and has History of cervical dysplasia; History of positive PPD; Generalized anxiety disorder; Migraine; Left lumbar radiculitis; History of VBAC; AMA (advanced maternal age) multigravida 35+; Supervision of other normal pregnancy, antepartum; and Low lying placenta, antepartum on their problem list.  Patient reports no complaints.  Contractions: Regular. Vag. Bleeding: None.  Movement: Present. Denies leaking of fluid.   The following portions of the patient's history were reviewed and updated as appropriate: allergies, current medications, past family history, past medical history, past social history, past surgical history and problem list.   Objective:   Vitals:   02/16/21 0839  BP: 106/67  Pulse: 85  Weight: 185 lb (83.9 kg)    Fetal Status: Fetal Heart Rate (bpm): 141   Movement: Present     General:  Alert, oriented and cooperative. Patient is in no acute distress.  Skin: Skin is warm and dry. No rash noted.   Cardiovascular: Normal heart rate noted  Respiratory: Normal respiratory effort, no problems with respiration noted  Abdomen: Soft, gravid, appropriate for gestational age.  Pain/Pressure: Absent     Pelvic: Cervical exam deferred        Extremities: Normal range of motion.  Edema: Trace  Mental Status: Normal mood and affect. Normal behavior. Normal judgment and thought content.   Assessment and Plan:  Pregnancy: G4P3003 at [redacted]w[redacted]d 1. History of VBAC - Desires RLTCS at this time due to LLP - The risks of surgery were discussed with the patient including but were not limited to: bleeding which may require transfusion or reoperation; infection which may require antibiotics; injury to bowel, bladder, ureters or other surrounding organs; injury to the fetus; need for  additional procedures including hysterectomy in the event of a life-threatening hemorrhage; formation of adhesions; placental abnormalities with subsequent pregnancies; incisional problems; thromboembolic phenomenon and other postoperative/anesthesia complications.   - She also desires permanent sterilization at the time of her surgery. We discussed this is permanent and not reversible.   2. Supervision of other normal pregnancy, antepartum - GBS/GC/CT next time as precaution - flu shot given today  3. Low lying placenta, antepartum - US done on 10/6 - placenta is 98mm from os - She would like repeat c-section. Msg sent to surgery scheduler to arrange for at 38w per MFM recommendation  Preterm labor symptoms and general obstetric precautions including but not limited to vaginal bleeding, contractions, leaking of fluid and fetal movement were reviewed in detail with the patient. Please refer to After Visit Summary for other counseling recommendations.   Return in about 2 weeks (around 03/02/2021) for OB VISIT, MD or APP.  Future Appointments  Date Time Provider Department Center  02/16/2021  8:50 AM Milas Hock, MD [facility]    Milas Hock, MD

## 2021-02-16 ENCOUNTER — Other Ambulatory Visit: Payer: Self-pay | Admitting: Obstetrics and Gynecology

## 2021-02-16 ENCOUNTER — Ambulatory Visit (INDEPENDENT_AMBULATORY_CARE_PROVIDER_SITE_OTHER): Payer: BC Managed Care – PPO | Admitting: Obstetrics and Gynecology

## 2021-02-16 ENCOUNTER — Encounter: Payer: Self-pay | Admitting: Obstetrics and Gynecology

## 2021-02-16 ENCOUNTER — Other Ambulatory Visit: Payer: Self-pay

## 2021-02-16 ENCOUNTER — Telehealth: Payer: Self-pay | Admitting: *Deleted

## 2021-02-16 VITALS — BP 106/67 | HR 85 | Wt 185.0 lb

## 2021-02-16 DIAGNOSIS — Z3403 Encounter for supervision of normal first pregnancy, third trimester: Secondary | ICD-10-CM

## 2021-02-16 DIAGNOSIS — Z98891 History of uterine scar from previous surgery: Secondary | ICD-10-CM

## 2021-02-16 DIAGNOSIS — O4440 Low lying placenta NOS or without hemorrhage, unspecified trimester: Secondary | ICD-10-CM

## 2021-02-16 DIAGNOSIS — Z3480 Encounter for supervision of other normal pregnancy, unspecified trimester: Secondary | ICD-10-CM

## 2021-02-16 DIAGNOSIS — Z23 Encounter for immunization: Secondary | ICD-10-CM

## 2021-02-16 NOTE — Telephone Encounter (Signed)
Call to patient. Advised of c-section date 03-08-21 at 0930.  Request date adjustment to 10-28 due to other child's birthday. Advised will need to review with provider and call back.

## 2021-02-16 NOTE — Telephone Encounter (Signed)
After review with patient and provider- c-section rescheduled to 03-10-21 at 1230 and arrive at 1030.Will receive call for pre-op instructions as well as visible confirmation on My Chart.

## 2021-02-19 ENCOUNTER — Encounter: Payer: BC Managed Care – PPO | Admitting: Obstetrics and Gynecology

## 2021-02-27 ENCOUNTER — Encounter (HOSPITAL_COMMUNITY): Payer: Self-pay

## 2021-02-27 ENCOUNTER — Other Ambulatory Visit: Payer: Self-pay | Admitting: Family Medicine

## 2021-02-27 NOTE — Patient Instructions (Signed)
Mary Mcpherson  02/27/2021   Your procedure is scheduled on:  03/10/2021  Arrive at 1030 at Entrance C on [address] at [facility]  and [employer]. You are invited to use the FREE valet parking or use the Visitor's parking deck.  Pick up the phone at the desk and dial [phone].  Call this number if you have problems the morning of surgery: [phone]  Remember:   Do not eat food:(After Midnight) Desps de medianoche.  Do not drink clear liquids: (After Midnight) Desps de medianoche.  Take these medicines the morning of surgery with A SIP OF WATER:  none   Do not wear jewelry, make-up or nail polish.  Do not wear lotions, powders, or perfumes. Do not wear deodorant.  Do not shave 48 hours prior to surgery.  Do not bring valuables to the hospital.  [facility] is not   responsible for any belongings or valuables brought to the hospital.  Contacts, dentures or bridgework may not be worn into surgery.  Leave suitcase in the car. After surgery it may be brought to your room.  For patients admitted to the hospital, checkout time is 11:00 AM the day of              discharge.      Please read over the following fact sheets that you were given:     Preparing for Surgery

## 2021-02-28 ENCOUNTER — Encounter (HOSPITAL_COMMUNITY)
Admission: AD | Disposition: A | Discharge status: Home / Self Care | Payer: Self-pay | Source: Home / Self Care | Attending: Obstetrics & Gynecology

## 2021-02-28 ENCOUNTER — Inpatient Hospital Stay (HOSPITAL_COMMUNITY): Payer: BC Managed Care – PPO | Admitting: Certified Registered"

## 2021-02-28 ENCOUNTER — Encounter (HOSPITAL_COMMUNITY): Payer: Self-pay | Admitting: Obstetrics & Gynecology

## 2021-02-28 ENCOUNTER — Inpatient Hospital Stay (HOSPITAL_COMMUNITY): Admit: 2021-02-28 | Payer: BC Managed Care – PPO | Admitting: Family Medicine

## 2021-02-28 ENCOUNTER — Inpatient Hospital Stay (HOSPITAL_BASED_OUTPATIENT_CLINIC_OR_DEPARTMENT_OTHER): Payer: BC Managed Care – PPO

## 2021-02-28 ENCOUNTER — Inpatient Hospital Stay (HOSPITAL_COMMUNITY)
Admission: AD | Admit: 2021-02-28 | Discharge: 2021-03-03 | DRG: 784 | Disposition: A | Discharge status: Home / Self Care | Payer: BC Managed Care – PPO | Attending: Obstetrics & Gynecology | Admitting: Obstetrics & Gynecology

## 2021-02-28 ENCOUNTER — Other Ambulatory Visit: Payer: Self-pay

## 2021-02-28 DIAGNOSIS — O34211 Maternal care for low transverse scar from previous cesarean delivery: Secondary | ICD-10-CM | POA: Diagnosis not present

## 2021-02-28 DIAGNOSIS — Z20822 Contact with and (suspected) exposure to covid-19: Secondary | ICD-10-CM | POA: Diagnosis present

## 2021-02-28 DIAGNOSIS — O4443 Low lying placenta NOS or without hemorrhage, third trimester: Secondary | ICD-10-CM | POA: Diagnosis not present

## 2021-02-28 DIAGNOSIS — O42913 Preterm premature rupture of membranes, unspecified as to length of time between rupture and onset of labor, third trimester: Secondary | ICD-10-CM | POA: Diagnosis not present

## 2021-02-28 DIAGNOSIS — O34219 Maternal care for unspecified type scar from previous cesarean delivery: Secondary | ICD-10-CM

## 2021-02-28 DIAGNOSIS — O42919 Preterm premature rupture of membranes, unspecified as to length of time between rupture and onset of labor, unspecified trimester: Secondary | ICD-10-CM | POA: Diagnosis present

## 2021-02-28 DIAGNOSIS — O09523 Supervision of elderly multigravida, third trimester: Secondary | ICD-10-CM

## 2021-02-28 DIAGNOSIS — Z302 Encounter for sterilization: Secondary | ICD-10-CM | POA: Diagnosis not present

## 2021-02-28 DIAGNOSIS — O4403 Placenta previa specified as without hemorrhage, third trimester: Secondary | ICD-10-CM | POA: Diagnosis not present

## 2021-02-28 DIAGNOSIS — Z3A36 36 weeks gestation of pregnancy: Secondary | ICD-10-CM

## 2021-02-28 DIAGNOSIS — Z98891 History of uterine scar from previous surgery: Secondary | ICD-10-CM

## 2021-02-28 DIAGNOSIS — O42013 Preterm premature rupture of membranes, onset of labor within 24 hours of rupture, third trimester: Secondary | ICD-10-CM | POA: Diagnosis not present

## 2021-02-28 DIAGNOSIS — Z3490 Encounter for supervision of normal pregnancy, unspecified, unspecified trimester: Secondary | ICD-10-CM

## 2021-02-28 DIAGNOSIS — O4440 Low lying placenta NOS or without hemorrhage, unspecified trimester: Secondary | ICD-10-CM

## 2021-02-28 DIAGNOSIS — N838 Other noninflammatory disorders of ovary, fallopian tube and broad ligament: Secondary | ICD-10-CM | POA: Diagnosis not present

## 2021-02-28 DIAGNOSIS — Z3403 Encounter for supervision of normal first pregnancy, third trimester: Secondary | ICD-10-CM

## 2021-02-28 LAB — CBC
HCT: 34.6 % — ABNORMAL LOW (ref 36.0–46.0)
Hemoglobin: 11.7 g/dL — ABNORMAL LOW (ref 12.0–15.0)
MCH: 31 pg (ref 26.0–34.0)
MCHC: 33.8 g/dL (ref 30.0–36.0)
MCV: 91.5 fL (ref 80.0–100.0)
Platelets: 276 10*3/uL (ref 150–400)
RBC: 3.78 MIL/uL — ABNORMAL LOW (ref 3.87–5.11)
RDW: 13.2 % (ref 11.5–15.5)
WBC: 8.4 10*3/uL (ref 4.0–10.5)
nRBC: 0 % (ref 0.0–0.2)

## 2021-02-28 LAB — RESP PANEL BY RT-PCR (FLU A&B, COVID) ARPGX2
Influenza A by PCR: NEGATIVE
Influenza B by PCR: NEGATIVE
SARS Coronavirus 2 by RT PCR: NEGATIVE

## 2021-02-28 LAB — GROUP B STREP BY PCR: Group B strep by PCR: NEGATIVE

## 2021-02-28 LAB — PREPARE RBC (CROSSMATCH)

## 2021-02-28 SURGERY — Surgical Case
Anesthesia: Spinal | Wound class: Clean Contaminated

## 2021-02-28 MED ORDER — OXYCODONE HCL 5 MG/5ML PO SOLN: 5.0000 mg | Freq: Once | ORAL | Status: DC | PRN

## 2021-02-28 MED ORDER — KETOROLAC TROMETHAMINE 30 MG/ML IJ SOLN
30.0000 mg | Freq: Four times a day (QID) | INTRAMUSCULAR | Status: DC
  Administered 2021-03-01 (×2): 30 mg via INTRAVENOUS
  Filled 2021-02-28 (×2): qty 1

## 2021-02-28 MED ORDER — NALOXONE HCL 4 MG/10ML IJ SOLN
1.0000 ug/kg/h | INTRAVENOUS | Status: DC | PRN
  Filled 2021-02-28: qty 5

## 2021-02-28 MED ORDER — DIPHENHYDRAMINE HCL 50 MG/ML IJ SOLN: 12.5000 mg | INTRAMUSCULAR | Status: DC | PRN

## 2021-02-28 MED ORDER — SODIUM CHLORIDE 0.9% IV SOLUTION
Freq: Once | INTRAVENOUS | Status: AC
  Administered 2021-02-28: 1000 mL/h via INTRAVENOUS

## 2021-02-28 MED ORDER — PHENYLEPHRINE HCL-NACL 20-0.9 MG/250ML-% IV SOLN
INTRAVENOUS | Status: DC | PRN
  Administered 2021-02-28: 30 ug/min via INTRAVENOUS

## 2021-02-28 MED ORDER — SODIUM CHLORIDE 0.9 % IR SOLN
Status: DC | PRN
  Administered 2021-02-28: 1

## 2021-02-28 MED ORDER — KETOROLAC TROMETHAMINE 30 MG/ML IJ SOLN
INTRAMUSCULAR | Status: AC
  Filled 2021-02-28: qty 1

## 2021-02-28 MED ORDER — CEFAZOLIN SODIUM-DEXTROSE 2-4 GM/100ML-% IV SOLN
2.0000 g | INTRAVENOUS | Status: AC
  Administered 2021-02-28: 2 g via INTRAVENOUS
  Filled 2021-02-28: qty 100

## 2021-02-28 MED ORDER — PHENYLEPHRINE 40 MCG/ML (10ML) SYRINGE FOR IV PUSH (FOR BLOOD PRESSURE SUPPORT)
PREFILLED_SYRINGE | INTRAVENOUS | Status: AC
  Filled 2021-02-28: qty 10

## 2021-02-28 MED ORDER — MEPERIDINE HCL 25 MG/ML IJ SOLN: 6.2500 mg | INTRAMUSCULAR | Status: DC | PRN

## 2021-02-28 MED ORDER — NALBUPHINE HCL 10 MG/ML IJ SOLN
5.0000 mg | INTRAMUSCULAR | Status: DC | PRN
  Administered 2021-03-01: 5 mg via INTRAVENOUS
  Filled 2021-02-28: qty 1

## 2021-02-28 MED ORDER — NALBUPHINE HCL 10 MG/ML IJ SOLN: 5.0000 mg | Freq: Once | INTRAMUSCULAR | Status: DC | PRN

## 2021-02-28 MED ORDER — OXYCODONE HCL 5 MG PO TABS: 5.0000 mg | ORAL_TABLET | Freq: Once | ORAL | Status: DC | PRN

## 2021-02-28 MED ORDER — PROMETHAZINE HCL 25 MG/ML IJ SOLN: 6.2500 mg | INTRAMUSCULAR | Status: DC | PRN

## 2021-02-28 MED ORDER — MORPHINE SULFATE (PF) 0.5 MG/ML IJ SOLN
INTRAMUSCULAR | Status: DC | PRN
  Administered 2021-02-28: 150 ug via EPIDURAL

## 2021-02-28 MED ORDER — KETOROLAC TROMETHAMINE 30 MG/ML IJ SOLN
30.0000 mg | Freq: Once | INTRAMUSCULAR | Status: AC
  Administered 2021-02-28: 30 mg via INTRAVENOUS

## 2021-02-28 MED ORDER — FENTANYL CITRATE (PF) 100 MCG/2ML IJ SOLN
INTRAMUSCULAR | Status: AC
  Filled 2021-02-28: qty 2

## 2021-02-28 MED ORDER — OXYTOCIN-SODIUM CHLORIDE 30-0.9 UT/500ML-% IV SOLN
INTRAVENOUS | Status: DC | PRN
  Administered 2021-02-28: 30 mL via INTRAVENOUS

## 2021-02-28 MED ORDER — DIPHENHYDRAMINE HCL 25 MG PO CAPS: 25.0000 mg | ORAL_CAPSULE | ORAL | Status: DC | PRN

## 2021-02-28 MED ORDER — POVIDONE-IODINE 10 % EX SWAB
2.0000 "application " | Freq: Once | CUTANEOUS | Status: AC
  Administered 2021-02-28: 2 via TOPICAL

## 2021-02-28 MED ORDER — HYDROMORPHONE HCL 1 MG/ML IJ SOLN: 0.2500 mg | INTRAMUSCULAR | Status: DC | PRN

## 2021-02-28 MED ORDER — HEPARIN (PORCINE) IN NACL 2-0.9 UNITS/ML: INTRAMUSCULAR | Status: DC

## 2021-02-28 MED ORDER — DIPHENHYDRAMINE HCL 50 MG/ML IJ SOLN
INTRAMUSCULAR | Status: AC
  Filled 2021-02-28: qty 1

## 2021-02-28 MED ORDER — ONDANSETRON HCL 4 MG/2ML IJ SOLN
INTRAMUSCULAR | Status: DC | PRN
  Administered 2021-02-28: 4 mg via INTRAVENOUS

## 2021-02-28 MED ORDER — SOD CITRATE-CITRIC ACID 500-334 MG/5ML PO SOLN
30.0000 mL | ORAL | Status: AC
  Administered 2021-02-28: 30 mL via ORAL
  Filled 2021-02-28: qty 30

## 2021-02-28 MED ORDER — IBUPROFEN 600 MG PO TABS
600.0000 mg | ORAL_TABLET | Freq: Four times a day (QID) | ORAL | Status: DC
  Administered 2021-03-01: 600 mg via ORAL
  Filled 2021-02-28: qty 1

## 2021-02-28 MED ORDER — STERILE WATER FOR IRRIGATION IR SOLN
Status: DC | PRN
  Administered 2021-02-28: 1

## 2021-02-28 MED ORDER — BUPIVACAINE IN DEXTROSE 0.75-8.25 % IT SOLN
INTRATHECAL | Status: DC | PRN
  Administered 2021-02-28: 1.6 mL via INTRATHECAL

## 2021-02-28 MED ORDER — DIPHENHYDRAMINE HCL 50 MG/ML IJ SOLN
12.5000 mg | Freq: Once | INTRAMUSCULAR | Status: AC
  Administered 2021-02-28: 12.5 mg via INTRAVENOUS

## 2021-02-28 MED ORDER — NALOXONE HCL 0.4 MG/ML IJ SOLN: 0.4000 mg | INTRAMUSCULAR | Status: DC | PRN

## 2021-02-28 MED ORDER — ACETAMINOPHEN 500 MG PO TABS: 1000.0000 mg | ORAL_TABLET | ORAL | Status: DC

## 2021-02-28 MED ORDER — SODIUM CHLORIDE 0.9% FLUSH: 3.0000 mL | INTRAVENOUS | Status: DC | PRN

## 2021-02-28 MED ORDER — DEXAMETHASONE SODIUM PHOSPHATE 10 MG/ML IJ SOLN
INTRAMUSCULAR | Status: DC | PRN
  Administered 2021-02-28: 10 mg via INTRAVENOUS

## 2021-02-28 MED ORDER — EPHEDRINE 5 MG/ML INJ
INTRAVENOUS | Status: AC
  Filled 2021-02-28: qty 5

## 2021-02-28 MED ORDER — MORPHINE SULFATE (PF) 0.5 MG/ML IJ SOLN
INTRAMUSCULAR | Status: AC
  Filled 2021-02-28: qty 10

## 2021-02-28 MED ORDER — NALBUPHINE HCL 10 MG/ML IJ SOLN: 5.0000 mg | INTRAMUSCULAR | Status: DC | PRN

## 2021-02-28 MED ORDER — FENTANYL CITRATE (PF) 100 MCG/2ML IJ SOLN
INTRAMUSCULAR | Status: DC | PRN
  Administered 2021-02-28: 15 ug via INTRATHECAL

## 2021-02-28 SURGICAL SUPPLY — 37 items
BENZOIN TINCTURE PRP APPL 2/3 (GAUZE/BANDAGES/DRESSINGS) ×2 IMPLANT
CATH ROBINSON RED A/P 16FR (CATHETERS) IMPLANT
CHLORAPREP W/TINT 26ML (MISCELLANEOUS) ×4 IMPLANT
CLAMP CORD UMBIL (MISCELLANEOUS) IMPLANT
CLOSURE STERI STRIP 1/2 X4 (GAUZE/BANDAGES/DRESSINGS) ×2 IMPLANT
CLOTH BEACON ORANGE TIMEOUT ST (SAFETY) ×2 IMPLANT
DRSG OPSITE POSTOP 4X10 (GAUZE/BANDAGES/DRESSINGS) ×2 IMPLANT
ELECT REM PT RETURN 9FT ADLT (ELECTROSURGICAL) ×2
ELECTRODE REM PT RTRN 9FT ADLT (ELECTROSURGICAL) ×1 IMPLANT
EXTRACTOR VACUUM M CUP 4 TUBE (SUCTIONS) IMPLANT
GLOVE BIOGEL PI IND STRL 7.0 (GLOVE) ×1 IMPLANT
GLOVE BIOGEL PI IND STRL 7.5 (GLOVE) ×1 IMPLANT
GLOVE BIOGEL PI INDICATOR 7.0 (GLOVE) ×1
GLOVE BIOGEL PI INDICATOR 7.5 (GLOVE) ×1
GLOVE ECLIPSE 7.5 STRL STRAW (GLOVE) ×2 IMPLANT
GOWN STRL REUS W/TWL LRG LVL3 (GOWN DISPOSABLE) ×6 IMPLANT
KIT ABG SYR 3ML LUER SLIP (SYRINGE) IMPLANT
NEEDLE HYPO 25X5/8 SAFETYGLIDE (NEEDLE) IMPLANT
NS IRRIG 1000ML POUR BTL (IV SOLUTION) ×2 IMPLANT
PACK C SECTION WH (CUSTOM PROCEDURE TRAY) ×2 IMPLANT
PAD OB MATERNITY 4.3X12.25 (PERSONAL CARE ITEMS) ×2 IMPLANT
PENCIL SMOKE EVAC W/HOLSTER (ELECTROSURGICAL) ×2 IMPLANT
RTRCTR C-SECT PINK 25CM LRG (MISCELLANEOUS) ×2 IMPLANT
STRIP CLOSURE SKIN 1/2X4 (GAUZE/BANDAGES/DRESSINGS) ×2 IMPLANT
SUT PLAIN 2 0 (SUTURE) ×2
SUT PLAIN ABS 2-0 54XMFL TIE (SUTURE) ×1 IMPLANT
SUT PLAIN ABS 2-0 CT1 27XMFL (SUTURE) ×1 IMPLANT
SUT VIC AB 0 CTX 36 (SUTURE) ×3
SUT VIC AB 0 CTX36XBRD ANBCTRL (SUTURE) ×3 IMPLANT
SUT VIC AB 2-0 CT1 27 (SUTURE) ×1
SUT VIC AB 2-0 CT1 TAPERPNT 27 (SUTURE) ×1 IMPLANT
SUT VIC AB 3-0 SH 27 (SUTURE) ×1
SUT VIC AB 3-0 SH 27X BRD (SUTURE) ×1 IMPLANT
SUT VIC AB 4-0 KS 27 (SUTURE) ×2 IMPLANT
TOWEL OR 17X24 6PK STRL BLUE (TOWEL DISPOSABLE) ×2 IMPLANT
TRAY FOLEY W/BAG SLVR 14FR LF (SET/KITS/TRAYS/PACK) ×2 IMPLANT
WATER STERILE IRR 1000ML POUR (IV SOLUTION) ×2 IMPLANT

## 2021-02-28 NOTE — Op Note (Signed)
PreOp Diagnosis: 1) Intrauterine pregnancy @ [redacted]w[redacted]d 2) Prior C-section x 2 3) Low lying placenta 4) Preterm premature rupture of membranes 5) Desire for permanent sterilization  PostOp Diagnosis: same Procedure: Repeat C-section Surgeon: Dr. Myna Hidalgo Anesthesia: spinal Complications: none EBL: 281cc UOP: 200cc Fluids: 1800cc  Findings: Female infant from vertex presentation, normal uterus, tubes and ovaries bilaterally.  PROCEDURE:  Informed consent was obtained from the patient with risks, benefits, complications, treatment options, and expected outcomes discussed with the patient.  The patient concurred with the proposed plan, giving informed consent with form signed.   The patient was taken to Operating Room, and identified with the procedure verified as C-Section Delivery with Time Out. With induction of anesthesia, the patient was prepped and draped in the usual sterile fashion. A Pfannenstiel incision was made and carried down through the subcutaneous tissue to the fascia. The fascia was incised in the midline and extended transversely. The superior aspect of the fascial incision was grasped with Kochers elevated and the underlying muscle dissected off. The inferior aspect of the facial incision was in similar fashion, grasped elevated and rectus muscles dissected off. The peritoneum was identified and entered. Peritoneal incision was extended longitudinally. Alexis retractor was placed.  The utero-vesical peritoneal reflection was identified and incised transversely with the [facility] scissors, the incision extended laterally, the bladder flap created digitally. A low transverse uterine incision was made and the infants head delivered atraumatically. After the umbilical cord was clamped and cut cord blood was obtained for evaluation.   The placenta was removed intact and appeared normal. The uterine outline, tubes and ovaries appeared normal. The uterine incision was closed with running  locked sutures of 0 Vicryl and a second layer of the same stitch was used in an imbricating fashion.  Excellent hemostasis was obtained.  Attention was then turned to the left fallopian tube. Kelly forceps were placed on the mesosalpinx underneath most of the tube.  This pedicle was double suture ligated with 2-0 Vicryl and a plain gut free tie.  The tube including the fimbriated end was excised.  The right fallopian tube was then identified, doubly ligated, and was excised in a similar fashion allowing for bilateral tubal sterilization via bilateral salpingectomy.  Good hemostasis was noted overall.  The hysterotomy was examined and again excellent hemostasis was noted.  Alexis retractor was removed.  The fascia was then reapproximated with running sutures of 0 Vicryl. The subcutaneous tissue was reapproximated with 2-0 plain gut suture.  The skin was closed with 4-0 vicryl in a subcuticular fashion.  Instrument, sponge, and needle counts were correct prior the abdominal closure and at the conclusion of the case. The patient was taken to recovery in stable condition.   Myna Hidalgo, DO Attending Obstetrician & Gynecologist, [facility] for [employer], [facility]

## 2021-02-28 NOTE — H&P (Signed)
Obstetric Preoperative History and Physical  Mary Mcpherson is a 35 y.o. [phone] with IUP at [redacted]w[redacted]d who presented for PPROM.  She denies painful contractions.  No vaginal bleeding.  +FM    Pt with h/o C-section x 2, prior VBAC x1.  She may be interested in vaginal delivery  Cesarean Section Indication:  repeat C-section x 2, desire for permanent sterilization  Prenatal Course Source of Care: [facility]  with onset of care at 10 weeks Pregnancy complications or risks: Patient Active Problem List   Diagnosis Date Noted   Pregnancy 02/28/2021   Low lying placenta, antepartum 10/27/2020   History of VBAC 08/25/2020   AMA (advanced maternal age) multigravida 35+ 08/25/2020   Supervision of other normal pregnancy, antepartum 08/25/2020   Left lumbar radiculitis 05/13/2020   Migraine 04/14/2015   Generalized anxiety disorder 03/01/2014   History of positive PPD 07/02/2013   History of cervical dysplasia 06/22/2013   She plans to breastfeed She desires bilateral tubal ligation for postpartum contraception.   Prenatal labs and studies: ABO, Rh: --/--/O POS (10/22 1744) Antibody: NEG (10/22 1744) Rubella: 1.27 (04/18 0858) RPR: NON-REACTIVE (08/16 0843)  HBsAg: NON-REACTIVE (04/18 0858)  HIV: NON-REACTIVE (08/16 0843)  TIR:WERXVQMG/-- (10/22 1744) Glucola normal Genetic screening normal Anatomy US abnormal with placenta previa  Prenatal Transfer Tool  Maternal Diabetes: No Genetic Screening: Normal Maternal Ultrasounds/Referrals: Other: low lying placenta Fetal Ultrasounds or other Referrals:  Referred to Materal Fetal Medicine  Maternal Substance Abuse:  No Significant Maternal Medications:  None Significant Maternal Lab Results: Group B Strep negative  Past Medical History:  Diagnosis Date   Anxiety    Depression    Dysplasia of cervix 2007   Migraines    Shingles 03/01/2014   VBAC, delivered 03/10/2019    Past Surgical History:  Procedure Laterality Date    CESAREAN SECTION  2006,2007   x 2   procedure for dysplasia  2007   WISDOM TOOTH EXTRACTION  2010    OB History  Gravida Para Term Preterm AB Living  4 3 3     3   SAB IAB Ectopic Multiple Live Births        0 1    # Outcome Date GA Lbr Len/2nd Weight Sex Delivery Anes PTL Lv  4 Current           3 Term 03/09/19 [redacted]w[redacted]d 09:08 / 00:34 3490 g F VBAC None  LIV  2 Term      CS-LTranv     1 Term      CS-LTranv       Social History   Socioeconomic History   Marital status: Married    Spouse name: Not on file   Number of children: Not on file   Years of education: Not on file   Highest education level: Not on file  Occupational History   Not on file  Tobacco Use   Smoking status: Never   Smokeless tobacco: Never  Vaping Use   Vaping Use: Never used  Substance and Sexual Activity   Alcohol use: Not Currently    Alcohol/week: 1.0 standard drink    Types: 1 Glasses of wine per week   Drug use: No   Sexual activity: Yes    Partners: Male    Birth control/protection: None  Other Topics Concern   Not on file  Social History Narrative   Not on file   Social Determinants of Health   Financial Resource Strain: Not on file  Food Insecurity:  Not on file  Transportation Needs: Not on file  Physical Activity: Not on file  Stress: Not on file  Social Connections: Not on file    Family History  Problem Relation Age of Onset   Cancer Maternal Grandmother    Heart attack Other        parent   Diabetes Other        parents   Hypertension Other        paternal grandmother   Hyperlipidemia Other        paternal granmother   Heart disease Paternal Grandmother     Medications Prior to Admission  Medication Sig Dispense Refill Last Dose   aspirin EC 81 MG tablet Take 1 tablet (81 mg total) by mouth daily. Take after 12 weeks for prevention of preeclampsia later in pregnancy 300 tablet 2 Past Month   omeprazole (PRILOSEC) 10 MG capsule Take 10 mg by mouth daily.   02/28/2021    Prenatal Vit-Fe Fumarate-FA (PRENATAL VITAMINS PO) Take by mouth.   02/28/2021   ondansetron (ZOFRAN) 4 MG tablet Take 1 tablet (4 mg total) by mouth every 8 (eight) hours as needed for nausea or vomiting. (Patient not taking: No sig reported) 20 tablet 1     No Known Allergies  Review of Systems: Pertinent items noted in HPI and remainder of comprehensive ROS otherwise negative.  Physical Exam: BP 118/86 (BP Location: Right Arm)   Pulse (!) 101   Temp 98 F (36.7 C) (Oral)   Resp 17   LMP 06/15/2020   SpO2 98%  FHR by Doppler: 135 bpm CONSTITUTIONAL: Well-developed, well-nourished female in no acute distress.  EYES: Conjunctivae and EOM are normal. Pupils are equal, round, and reactive to light. No scleral icterus.  NECK: Normal range of motion, supple, no masses SKIN: Skin is warm and dry. No rash noted. Not diaphoretic. No erythema. No pallor. NEUROLGIC: Alert and oriented to person, place, and time. Normal reflexes, muscle tone coordination. No cranial nerve deficit noted. PSYCHIATRIC: Normal mood and affect. Normal behavior. Normal judgment and thought content. CARDIOVASCULAR: Normal heart rate noted, regular rhythm RESPIRATORY: Effort and breath sounds normal, no problems with respiration noted ABDOMEN: Soft, nontender, nondistended, gravid. Well-healed Pfannenstiel incision. PELVIC: Deferred MUSCULOSKELETAL: Normal range of motion. No edema and no tenderness. 2+ distal pulses.  Pertinent Labs/Studies:   Results for orders placed or performed during the hospital encounter of 02/28/21 (from the past 72 hour(s))  Group B strep by PCR     Status: None   Collection Time: 02/28/21  5:44 PM   Specimen: Vaginal/Rectal; Genital  Result Value Ref Range   Group B strep by PCR NEGATIVE NEGATIVE    Comment: (NOTE) Intrapartum testing with Xpert GBS assay should be used as an adjunct to other methods available and not used to replace antepartum testing (at 35-[redacted] weeks  gestation). Performed at [facility], [address] [place], [place] [address]   Resp Panel by RT-PCR (Flu A&B, Covid) Nasopharyngeal Swab     Status: None   Collection Time: 02/28/21  5:44 PM   Specimen: Nasopharyngeal Swab; Nasopharyngeal(NP) swabs in vial transport medium  Result Value Ref Range   SARS Coronavirus 2 by RT PCR NEGATIVE NEGATIVE    Comment: (NOTE) SARS-CoV-2 target nucleic acids are NOT DETECTED.  The SARS-CoV-2 RNA is generally detectable in upper respiratory specimens during the acute phase of infection. The lowest concentration of SARS-CoV-2 viral copies this assay can detect is 138 copies/mL. A negative  result does not preclude SARS-Cov-2 infection and should not be used as the sole basis for treatment or other patient management decisions. A negative result may occur with  improper specimen collection/handling, submission of specimen other than nasopharyngeal swab, presence of viral mutation(s) within the areas targeted by this assay, and inadequate number of viral copies(<138 copies/mL). A negative result must be combined with clinical observations, patient history, and epidemiological information. The expected result is Negative.  Fact Sheet for Patients:  BloggerCourse.com  Fact Sheet for Healthcare Providers:  SeriousBroker.it  This test is no t yet approved or cleared by the [place] FDA and  has been authorized for detection and/or diagnosis of SARS-CoV-2 by FDA under an Emergency Use Authorization (EUA). This EUA will remain  in effect (meaning this test can be used) for the duration of the COVID-19 declaration under Section 564(b)(1) of the Act, 21 U.S.C.section 360bbb-3(b)(1), unless the authorization is terminated  or revoked sooner.       Influenza A by PCR NEGATIVE NEGATIVE   Influenza B by PCR NEGATIVE NEGATIVE    Comment: (NOTE) The Xpert Xpress SARS-CoV-2/FLU/RSV plus assay  is intended as an aid in the diagnosis of influenza from Nasopharyngeal swab specimens and should not be used as a sole basis for treatment. Nasal washings and aspirates are unacceptable for Xpert Xpress SARS-CoV-2/FLU/RSV testing.  Fact Sheet for Patients: BloggerCourse.com  Fact Sheet for Healthcare Providers: SeriousBroker.it  This test is not yet approved or cleared by the [place] FDA and has been authorized for detection and/or diagnosis of SARS-CoV-2 by FDA under an Emergency Use Authorization (EUA). This EUA will remain in effect (meaning this test can be used) for the duration of the COVID-19 declaration under Section 564(b)(1) of the Act, 21 U.S.C. section 360bbb-3(b)(1), unless the authorization is terminated or revoked.  Performed at [facility], [address] [place], [place] [address]   CBC     Status: Abnormal   Collection Time: 02/28/21  5:44 PM  Result Value Ref Range   WBC 8.4 4.0 - 10.5 K/uL   RBC 3.78 (L) 3.87 - 5.11 MIL/uL   Hemoglobin 11.7 (L) 12.0 - 15.0 g/dL   HCT 24.2 (L) 68.3 - 41.9 %   MCV 91.5 80.0 - 100.0 fL   MCH 31.0 26.0 - 34.0 pg   MCHC 33.8 30.0 - 36.0 g/dL   RDW 62.2 29.7 - 98.9 %   Platelets 276 150 - 400 K/uL   nRBC 0.0 0.0 - 0.2 %    Comment: Performed at [facility], [address] [place], [place] [address]  Type and screen     Status: None   Collection Time: 02/28/21  5:44 PM  Result Value Ref Range   ABO/RH(D) O POS    Antibody Screen NEG    Sample Expiration      03/03/2021,2359 Performed at [facility], [address] [place], [place] [address]     Assessment and Plan: Tonjia Parillo is a 35 y.o. [phone] at [redacted]w[redacted]d being admitted for Repeat C-section and bilateral salpingectomy.  Initially patient was considering VBAC.  [place] has confirmed that placenta is now >2cm from cervical os.  Discussed with patient TOLAC vs scheduled C-section.  Her main  concern is avoiding a stat C-section.  Discussed that if during delivery, excessive vaginal bleeding is noted a C-section would be indication.  After much discussion and review of all questions and concerns, she desires to proceed with repeat C-section  and bilateral salpingectomy.  The risks of surgery were discussed with the patient including but were not limited to: bleeding which may require transfusion or reoperation; infection which may require antibiotics; injury to bowel, bladder, ureters or other surrounding organs; injury to the fetus; need for additional procedures including hysterectomy in the event of a life-threatening hemorrhage; formation of adhesions; placental abnormalities wth subsequent pregnancies; incisional problems; thromboembolic phenomenon and other postoperative/anesthesia complications. Also discussed potential for hysterectomy should placenta accreta spectrum be identified.  The patient concurred with the proposed plan, giving informed written consent for the procedure. Last meal around 1pm. Anesthesia and OR aware. Preoperative prophylactic antibiotics and SCDs ordered on call to the OR. To OR when ready.   Katha Hamming, DO Obstetrician & Gynecologist, [facility] [employer], [facility]

## 2021-02-28 NOTE — Anesthesia Preprocedure Evaluation (Signed)
Anesthesia Evaluation  Patient identified by MRN, date of birth, ID band Patient awake    Reviewed: Allergy & Precautions, NPO status , Patient's Chart, lab work & pertinent test results  Airway Mallampati: II  TM Distance: >3 FB Neck ROM: Full    Dental no notable dental hx.    Pulmonary neg pulmonary ROS,    Pulmonary exam normal breath sounds clear to auscultation       Cardiovascular negative cardio ROS Normal cardiovascular exam Rhythm:Regular Rate:Normal     Neuro/Psych  Headaches, Anxiety Depression negative psych ROS   GI/Hepatic negative GI ROS, Neg liver ROS,   Endo/Other  negative endocrine ROS  Renal/GU negative Renal ROS  negative genitourinary   Musculoskeletal negative musculoskeletal ROS (+)   Abdominal   Peds negative pediatric ROS (+)  Hematology negative hematology ROS (+)   Anesthesia Other Findings   Reproductive/Obstetrics (+) Pregnancy                             Anesthesia Physical Anesthesia Plan  ASA: 2  Anesthesia Plan: Spinal   Post-op Pain Management:    Induction: Intravenous  PONV Risk Score and Plan: 2 and Treatment may vary due to age or medical condition  Airway Management Planned: Natural Airway  Additional Equipment:   Intra-op Plan:   Post-operative Plan:   Informed Consent: I have reviewed the patients History and Physical, chart, labs and discussed the procedure including the risks, benefits and alternatives for the proposed anesthesia with the patient or authorized representative who has indicated his/her understanding and acceptance.     Dental advisory given  Plan Discussed with: CRNA  Anesthesia Plan Comments:         Anesthesia Quick Evaluation

## 2021-02-28 NOTE — Transfer of Care (Signed)
Immediate Anesthesia Transfer of Care Note  Patient: Mary Mcpherson  Procedure(s) Performed: CESAREAN SECTION WITH BILATERAL TUBAL LIGATION  Patient Location: PACU  Anesthesia Type:Regional and Spinal  Level of Consciousness: awake, alert , oriented and patient cooperative  Airway & Oxygen Therapy: Patient Spontanous Breathing  Post-op Assessment: Report given to RN and Post -op Vital signs reviewed and stable  Post vital signs: Reviewed and stable  Last Vitals:  Vitals Value Taken Time  BP    Temp 36.5 C 02/28/21 2211  Pulse 72 02/28/21 2212  Resp 17 02/28/21 2212  SpO2 100 % 02/28/21 2212  Vitals shown include unvalidated device data.  Last Pain:  Vitals:   02/28/21 2211  TempSrc: Axillary         Complications: No notable events documented.

## 2021-02-28 NOTE — MAU Note (Signed)
Pt reports to mau with c/o possible SROM around 1400. Pt reports she had an initial gush of fluid and has continued to have trickles of fluid since then.  Denies ctx or vag bleeding. +FM

## 2021-02-28 NOTE — Anesthesia Procedure Notes (Signed)
Spinal  Patient location during procedure: OB Start time: 02/28/2021 8:50 PM End time: 02/28/2021 8:55 PM Reason for block: surgical anesthesia Staffing Performed: anesthesiologist  Anesthesiologist: Lowella Curb, MD Preanesthetic Checklist Completed: patient identified, IV checked, risks and benefits discussed, surgical consent, monitors and equipment checked, pre-op evaluation and timeout performed Spinal Block Patient position: sitting Prep: DuraPrep and site prepped and draped Patient monitoring: heart rate, cardiac monitor, continuous pulse ox and blood pressure Approach: midline Location: L3-4 Injection technique: single-shot Needle Needle type: Pencan  Needle gauge: 24 G Needle length: 10 cm Assessment Sensory level: T4 Events: CSF return

## 2021-03-01 DIAGNOSIS — O42919 Preterm premature rupture of membranes, unspecified as to length of time between rupture and onset of labor, unspecified trimester: Secondary | ICD-10-CM | POA: Diagnosis present

## 2021-03-01 LAB — CBC
HCT: 31.4 % — ABNORMAL LOW (ref 36.0–46.0)
Hemoglobin: 10.5 g/dL — ABNORMAL LOW (ref 12.0–15.0)
MCH: 31.2 pg (ref 26.0–34.0)
MCHC: 33.4 g/dL (ref 30.0–36.0)
MCV: 93.2 fL (ref 80.0–100.0)
Platelets: 231 10*3/uL (ref 150–400)
RBC: 3.37 MIL/uL — ABNORMAL LOW (ref 3.87–5.11)
RDW: 13.3 % (ref 11.5–15.5)
WBC: 12.6 10*3/uL — ABNORMAL HIGH (ref 4.0–10.5)
nRBC: 0 % (ref 0.0–0.2)

## 2021-03-01 LAB — RPR: RPR Ser Ql: NONREACTIVE

## 2021-03-01 MED ORDER — MEDROXYPROGESTERONE ACETATE 150 MG/ML IM SUSP: 150.0000 mg | INTRAMUSCULAR | Status: DC | PRN

## 2021-03-01 MED ORDER — DIPHENHYDRAMINE HCL 25 MG PO CAPS: 25.0000 mg | ORAL_CAPSULE | Freq: Four times a day (QID) | ORAL | Status: DC | PRN

## 2021-03-01 MED ORDER — OXYTOCIN-SODIUM CHLORIDE 30-0.9 UT/500ML-% IV SOLN
2.5000 [IU]/h | INTRAVENOUS | Status: AC
  Administered 2021-03-01: 2.5 [IU]/h via INTRAVENOUS
  Filled 2021-03-01: qty 500

## 2021-03-01 MED ORDER — ENOXAPARIN SODIUM 40 MG/0.4ML IJ SOSY
40.0000 mg | PREFILLED_SYRINGE | INTRAMUSCULAR | Status: DC
  Administered 2021-03-01 – 2021-03-02 (×2): 40 mg via SUBCUTANEOUS
  Filled 2021-03-01 (×2): qty 0.4

## 2021-03-01 MED ORDER — COCONUT OIL OIL
1.0000 "application " | TOPICAL_OIL | Status: DC | PRN
  Administered 2021-03-01: 1 via TOPICAL

## 2021-03-01 MED ORDER — TETANUS-DIPHTH-ACELL PERTUSSIS 5-2.5-18.5 LF-MCG/0.5 IM SUSY: 0.5000 mL | PREFILLED_SYRINGE | Freq: Once | INTRAMUSCULAR | Status: DC

## 2021-03-01 MED ORDER — SENNOSIDES-DOCUSATE SODIUM 8.6-50 MG PO TABS
2.0000 | ORAL_TABLET | Freq: Every day | ORAL | Status: DC
  Administered 2021-03-02 – 2021-03-03 (×2): 2 via ORAL
  Filled 2021-03-01 (×2): qty 2

## 2021-03-01 MED ORDER — MENTHOL 3 MG MT LOZG: 1.0000 | LOZENGE | OROMUCOSAL | Status: DC | PRN

## 2021-03-01 MED ORDER — LACTATED RINGERS IV SOLN: INTRAVENOUS | Status: DC

## 2021-03-01 MED ORDER — OXYCODONE HCL 5 MG PO TABS
5.0000 mg | ORAL_TABLET | ORAL | Status: DC | PRN
  Administered 2021-03-01 – 2021-03-03 (×6): 5 mg via ORAL
  Filled 2021-03-01 (×6): qty 1

## 2021-03-01 MED ORDER — IBUPROFEN 600 MG PO TABS
600.0000 mg | ORAL_TABLET | Freq: Four times a day (QID) | ORAL | Status: DC
  Administered 2021-03-01 – 2021-03-03 (×7): 600 mg via ORAL
  Filled 2021-03-01 (×7): qty 1

## 2021-03-01 MED ORDER — PRENATAL MULTIVITAMIN CH
1.0000 | ORAL_TABLET | Freq: Every day | ORAL | Status: DC
  Administered 2021-03-01 – 2021-03-03 (×3): 1 via ORAL
  Filled 2021-03-01 (×3): qty 1

## 2021-03-01 MED ORDER — IBUPROFEN 600 MG PO TABS: 600.0000 mg | ORAL_TABLET | Freq: Four times a day (QID) | ORAL | Status: DC

## 2021-03-01 MED ORDER — ACETAMINOPHEN 325 MG PO TABS
650.0000 mg | ORAL_TABLET | ORAL | Status: DC | PRN
  Administered 2021-03-01 – 2021-03-03 (×6): 650 mg via ORAL
  Filled 2021-03-01 (×6): qty 2

## 2021-03-01 MED ORDER — WITCH HAZEL-GLYCERIN EX PADS: 1.0000 "application " | MEDICATED_PAD | CUTANEOUS | Status: DC | PRN

## 2021-03-01 MED ORDER — SIMETHICONE 80 MG PO CHEW
80.0000 mg | CHEWABLE_TABLET | Freq: Three times a day (TID) | ORAL | Status: DC
  Administered 2021-03-01 – 2021-03-03 (×7): 80 mg via ORAL
  Filled 2021-03-01 (×8): qty 1

## 2021-03-01 MED ORDER — DIBUCAINE (PERIANAL) 1 % EX OINT: 1.0000 "application " | TOPICAL_OINTMENT | CUTANEOUS | Status: DC | PRN

## 2021-03-01 MED ORDER — MEASLES, MUMPS & RUBELLA VAC IJ SOLR: 0.5000 mL | Freq: Once | INTRAMUSCULAR | Status: DC

## 2021-03-01 MED ORDER — FERROUS SULFATE 325 (65 FE) MG PO TABS
325.0000 mg | ORAL_TABLET | ORAL | Status: DC
  Administered 2021-03-03: 325 mg via ORAL
  Filled 2021-03-01: qty 1

## 2021-03-01 MED ORDER — SIMETHICONE 80 MG PO CHEW
80.0000 mg | CHEWABLE_TABLET | ORAL | Status: DC | PRN
  Administered 2021-03-02 (×2): 80 mg via ORAL
  Filled 2021-03-01 (×2): qty 1

## 2021-03-01 NOTE — Anesthesia Postprocedure Evaluation (Signed)
Anesthesia Post Note  Patient: Mary Mcpherson  Procedure(s) Performed: CESAREAN SECTION WITH BILATERAL TUBAL LIGATION     Patient location during evaluation: PACU Anesthesia Type: Spinal Level of consciousness: awake and alert Pain management: pain level controlled Vital Signs Assessment: post-procedure vital signs reviewed and stable Respiratory status: spontaneous breathing, nonlabored ventilation and respiratory function stable Cardiovascular status: blood pressure returned to baseline and stable Postop Assessment: no apparent nausea or vomiting Anesthetic complications: no   No notable events documented.  Last Vitals:  Vitals:   02/28/21 2326 03/01/21 0045  BP: 106/66 95/63  Pulse: 63 77  Resp: 18 18  Temp: 37.1 C 36.9 C  SpO2: 99% 97%    Last Pain:  Vitals:   03/01/21 0045  TempSrc: Axillary  PainSc:    Pain Goal:                   Lowella Curb

## 2021-03-01 NOTE — Anesthesia Postprocedure Evaluation (Signed)
Anesthesia Post Note  Patient: Mary Mcpherson  Procedure(s) Performed: CESAREAN SECTION WITH BILATERAL TUBAL LIGATION     Patient location during evaluation: PACU Anesthesia Type: Spinal Level of consciousness: awake and alert Pain management: pain level controlled Vital Signs Assessment: post-procedure vital signs reviewed and stable Respiratory status: spontaneous breathing, nonlabored ventilation and respiratory function stable Cardiovascular status: blood pressure returned to baseline and stable Postop Assessment: no apparent nausea or vomiting Anesthetic complications: no   No notable events documented.  Last Vitals:  Vitals:   02/28/21 2326 03/01/21 0045  BP: 106/66 95/63  Pulse: 63 77  Resp: 18 18  Temp: 37.1 C 36.9 C  SpO2: 99% 97%    Last Pain:  Vitals:   03/01/21 0045  TempSrc: Axillary  PainSc:    Pain Goal:                   Tramel Westbrook Ray Kamori Kitchens     

## 2021-03-01 NOTE — Lactation Note (Signed)
This note was copied from a baby's chart. Lactation Consultation Note  Patient Name: Mary Mcpherson [id number] Date: 03/01/2021 Reason for consult: Initial assessment;Late-preterm 34-36.6wks Age:36 hours  LC in to room for initial visit. Mother states baby has been feeding well and denies any discomfort with latch.  Discussed LPTI behavior and shared "caring for LPTI" green sheet recommendations. Set up DEBP and reinforced frequency/use/care and milk storage.   Plan:   1-Breastfeeding on demand, ensuring a deep, comfortable latch.  2-Preserve infant energy limiting feeding sessions to 30 min max.  3-Hand pump or DEBP for supplementation purposes every 3h. 4-Encouraged maternal hydration, nutrition and rest.  Contact LC as needed for feeds/support/concerns/questions. All questions answered at this time. LC brochure and InJoy booklet reviewed.  Maternal Data Has patient been taught Hand Expression?: Yes Does the patient have breastfeeding experience prior to this delivery?: Yes How long did the patient breastfeed?: 3 months x2; 4 months x1  Feeding Mother's Current Feeding Choice: Breast Milk  Lactation Tools Discussed/Used Tools: Pump;Flanges Flange Size: 24 Breast pump type: Double-Electric Breast Pump;Manual Pump Education: Setup, frequency, and cleaning;Milk Storage Reason for Pumping: LPTI Pumping frequency: after feedings Pumped volume: 1 mL (with manual pump demonstration)  Interventions Interventions: Breast feeding basics reviewed;Skin to skin;Hand express;Breast massage;DEBP;Hand pump;Expressed milk;Education;LC Services brochure  Discharge Pump: Manual;DEBP;Personal WIC Program: No  Consult Status Consult Status: Follow-up Date: 03/02/21 Follow-up type: In-patient    Draven Laine A Higuera Ancidey 03/01/2021, 1:42 PM

## 2021-03-01 NOTE — Progress Notes (Addendum)
CSW met with MOB to complete consult for mental health. CSW observed MOB resting in bed, bonding with infant, and FOB's mother in recliner at bedside. MOB gave CSW verbal consent to complete consult while FOB's mother was present. CSW explained role, and reason for consult. MOB was pleasant, and polite during engagement with CSW. MOB reported, history of anxiety approximately ten years ago. MOB denied any history of psychotropic medication, and therapy involvement. MOB decline medication, and mental health resources. MOB reported, she has been able to manage symptoms without medication needed. CSW encourage MOB to implement healthy coping skills when symptoms arises.   MOB denied history of depression, but instead postpartum depression with her first child in 2006. CSW provided education regarding the baby blues period vs. perinatal mood disorders, discussed treatment and gave resources for mental health follow up if concerns arise. CSW recommends self- evaluation during the postpartum time period using the New Mom Checklist from Postpartum Progress and encouraged MOB to contact a medical professional if symptoms are noted at any time.   MOB reported, since delivery she feels, "good". MOB reported, FOB, and her in-laws are very supportive. MOB denied SI, and HI when CSW assessed for safety.   MOB reported, there are no transportation barriers to follow up infant's care. MOB reported, she has all essentials needed to care for infant. MOB reported, infant has a car seat, and bassinet. MOB denied any additional barriers.     CSW provided education on sudden infant death syndrome (SIDS).  CSW identifies no further need for intervention or barriers to discharge at this time.  Darcus Austin, MSW, LCSW-A Clinical Social Worker- Weekends [phone]

## 2021-03-01 NOTE — Progress Notes (Signed)
Postpartum Day 1: Cesarean Delivery  Subjective: Patient reports tolerating PO.  Has been OOB. No flatus yet. Pain controlled. Baby stable at bedside.  Objective: Vital signs in last 24 hours: Temp:  [97.7 F (36.5 C)-98.7 F (37.1 C)] 98.1 F (36.7 C) (10/23 0900) Pulse Rate:  [56-101] 56 (10/23 0512) Resp:  [11-24] 17 (10/23 0900) BP: (95-210)/(60-185) 95/60 (10/23 0512) SpO2:  [97 %-99 %] 98 % (10/23 0900)  Physical Exam:  General: alert and no distress Lochia: appropriate Uterine Fundus: firm, below umbilicus Incision: healing well, dressing C/D/I DVT Evaluation: No evidence of DVT seen on physical exam. Negative Homan's sign. No cords or calf tenderness.  Recent Labs    02/28/21 1744 03/01/21 0455  HGB 11.7* 10.5*  HCT 34.6* 31.4*    Assessment/Plan: Status post Cesarean section. Doing well postoperatively.  Pain medications as needed Encourage OOB. Possible discharge tomorrow if remains stable. Continue current care.  Jaynie Collins, MD 03/01/2021, 9:56 AM

## 2021-03-02 ENCOUNTER — Encounter (HOSPITAL_COMMUNITY): Payer: Self-pay | Admitting: Obstetrics & Gynecology

## 2021-03-02 ENCOUNTER — Encounter: Payer: Self-pay | Admitting: Family Medicine

## 2021-03-02 DIAGNOSIS — Z9079 Acquired absence of other genital organ(s): Secondary | ICD-10-CM | POA: Insufficient documentation

## 2021-03-02 MED ORDER — CALCIUM CARBONATE ANTACID 500 MG PO CHEW
1.0000 | CHEWABLE_TABLET | Freq: Two times a day (BID) | ORAL | Status: DC | PRN
  Administered 2021-03-02: 200 mg via ORAL
  Filled 2021-03-02: qty 1

## 2021-03-02 MED ORDER — IBUPROFEN 600 MG PO TABS: 600.0000 mg | ORAL_TABLET | Freq: Four times a day (QID) | ORAL | 0 refills | Status: DC

## 2021-03-02 MED ORDER — OXYCODONE HCL 5 MG PO TABS: 5.0000 mg | ORAL_TABLET | ORAL | 0 refills | Status: DC | PRN

## 2021-03-02 NOTE — Discharge Summary (Signed)
Postpartum Discharge Summary  Date of Service updated     Patient Name: Mary Mcpherson DOB: [birth year] MRN: [id number]  Date of admission: 02/28/2021 Delivery date:02/28/2021  Delivering provider: Janyth Pupa  Date of discharge: 03/02/2021  Admitting diagnosis: Pregnancy [Z34.90] Preterm premature rupture of membranes (PPROM) with unknown onset of labor [O42.919] Intrauterine pregnancy: [redacted]w[redacted]d    Secondary diagnosis:  Active Problems:   Pregnancy   Preterm premature rupture of membranes (PPROM) with unknown onset of labor  Additional problems: low lying placenta    Discharge diagnosis: Term Pregnancy Delivered                                              Post partum procedures:postpartum tubal ligation Augmentation: N/A Complications: None  Hospital course: Onset of Labor With Unplanned C/S   35y.o. yo [id number] 33w6das admitted in Latent Labor on 02/28/2021. Patient had a labor course significant for PPROM. The patient went for cesarean section due to  low lying placenta . Delivery details as follows: Membrane Rupture Time/Date: 1:00 PM ,02/28/2021   Delivery Method:C-Section, Low Transverse  Details of operation can be found in separate operative note. Patient had an uncomplicated postpartum course.  She is ambulating,tolerating a regular diet, passing flatus, and urinating well.  Patient is discharged home in stable condition 03/02/21.  Newborn Data: Birth date:02/28/2021  Birth time:9:17 PM  Gender:Female  Living status:Living  Apgars:7 ,8  Weight:2892 g   Magnesium Sulfate received: No BMZ received: No Rhophylac:N/A MMR:N/A T-DaP:Given prenatally Flu: No Transfusion:No  Physical exam  Vitals:   03/01/21 0512 03/01/21 0900 03/01/21 1456 03/01/21 2047  BP: 95/60  106/71 (!) 90/58  Pulse: (!) 56  (!) 59 73  Resp: _0 Temp: 98 F (36.7 C) 98.1 F (36.7 C) 98.3 F (36.8 C) 98.5 F (36.9 C)  TempSrc: Axillary  Oral Oral  SpO2: 98% 98% 98%  96%   General: alert, cooperative, and no distress Lochia: appropriate Uterine Fundus: firm Incision: Healing well with no significant drainage, No significant erythema, Dressing is clean, dry, and intact DVT Evaluation: No evidence of DVT seen on physical exam. Labs: Lab Results  Component Value Date   WBC 12.6 (H) 03/01/2021   HGB 10.5 (L) 03/01/2021   HCT 31.4 (L) 03/01/2021   MCV 93.2 03/01/2021   PLT 231 03/01/2021   CMP Latest Ref Rng & Units 07/03/2018  Glucose 65 - 99 mg/dL 87  BUN 7 - 25 mg/dL 11  Creatinine 0.50 - 1.10 mg/dL 0.74  Sodium 135 - 146 mmol/L 141  Potassium 3.5 - 5.3 mmol/L 4.1  Chloride 98 - 110 mmol/L 108  CO2 20 - 32 mmol/L 28  Calcium 8.6 - 10.2 mg/dL 9.4  Total Protein 6.1 - 8.1 g/dL 7.2  Total Bilirubin 0.2 - 1.2 mg/dL 0.4  Alkaline Phos 33 - 115 U/L -  AST 10 - 30 U/L 16  ALT 6 - 29 U/L 15   Edinburgh Score: Edinburgh Postnatal Depression Scale Screening Tool 03/01/2021  I have been able to laugh and see the funny side of things. 0  I have looked forward with enjoyment to things. 0  I have blamed myself unnecessarily when things went wrong. 1  I have been anxious or worried for no good reason. 0  I have felt scared or panicky for no  good reason. 0  Things have been getting on top of me. 0  I have been so unhappy that I have had difficulty sleeping. 0  I have felt sad or miserable. 1  I have been so unhappy that I have been crying. 1  The thought of harming myself has occurred to me. 0  Edinburgh Postnatal Depression Scale Total 3     After visit meds:  Allergies as of 03/02/2021   No Known Allergies      Medication List     STOP taking these medications    aspirin EC 81 MG tablet   ondansetron 4 MG tablet Commonly known as: Zofran       TAKE these medications    ibuprofen 600 MG tablet Commonly known as: ADVIL Take 1 tablet (600 mg total) by mouth every 6 (six) hours.   omeprazole 10 MG capsule Commonly known as:  PRILOSEC Take 10 mg by mouth daily.   oxyCODONE 5 MG immediate release tablet Commonly known as: Oxy IR/ROXICODONE Take 1-2 tablets (5-10 mg total) by mouth every 4 (four) hours as needed for moderate pain.   PRENATAL VITAMINS PO Take by mouth.        Discharge home in stable condition Infant Feeding: Breast Infant Disposition:home with mother Discharge instruction: per After Visit Summary and Postpartum booklet. Activity: Advance as tolerated. Pelvic rest for 6 weeks.  Diet: routine diet Future Appointments: Future Appointments  Date Time Provider [place]  03/09/2021 10:00 AM [facility] PAT 1 [facility] None   Follow up Visit:   Please schedule this patient for a In person postpartum visit in 4 weeks with the following provider: Any provider. Additional Postpartum F/U:Incision check 1 week  High risk pregnancy complicated by:  placenta previa Delivery mode:  C-Section, Low Transverse  Anticipated Birth Control:  BTL done PP   03/02/2021 Renee Harder, CNM

## 2021-03-02 NOTE — Lactation Note (Signed)
This note was copied from a baby's chart. Lactation Consultation Note  Patient Name: Mary Mcpherson [id number] Date: 03/02/2021 Reason for consult: Follow-up assessment;Nipple pain/trauma Age:36 hours  Mom called LC to access flange size. Mom states 24 causing areola pain. LC changed to 21 flange and allowed Mom to pump, stated comfortable fit.   Mom using EBM and coconut oil for wound healing.  Mom to call for latch assistance with next feeding, since noted abrasion from former latch on left breast.  Mom is supplementing after latching last 2 feedings 12 and 20 ml.   All questions answered at the end of the visit.   Maternal Data    Feeding Mother's Current Feeding Choice: Breast Milk Nipple Type: Slow - flow  LATCH Score                    Lactation Tools Discussed/Used Tools: Pump;Flanges;Coconut oil Flange Size: 21 (Mom using 24 flange complaining of areola pain. LC reaccessed flange size to 21, mother states comfortable fit but is aware that nipple size can change. LC noted healed abrasion on left outer quad of nipple mom attributed to latch.) Breast pump type: Double-Electric Breast Pump Pump Education: Setup, frequency, and cleaning;Milk Storage Reason for Pumping: stimulation Pumping frequency: every 3 hrs for  Interventions Interventions: Breast feeding basics reviewed;Education;Hand express;Expressed milk;Coconut oil;DEBP;Infant Driven Feeding Algorithm education  Discharge    Consult Status Consult Status: Follow-up Date: 03/03/21 Follow-up type: In-patient    Mary Middlebrooks  Mcpherson 03/02/2021, 6:54 PM

## 2021-03-02 NOTE — Lactation Note (Signed)
This note was copied from a baby's chart. Lactation Consultation Note  Patient Name: Mary Mcpherson NOMVE'H Date: 03/02/2021 Reason for consult: Follow-up assessment;Late-preterm 34-36.6wks Age:36 hours  Lactation in to visit with P4 Mom of LPTI delivered by C/S.  Baby is at 6.3% weight loss with 8 voids and 2 stools in last 24 hrs.  Baby has had 11 breastfeeds in last 24 hrs, latch scores of 9 and 10.  Mom started adding some pumping and feeding baby EBM due to weight loss and baby becoming a little sleepier at the breast today.  Baby took two feedings of 10 ml each for a total of 20 ml.    Mom resting in bed following supplementing baby 20 ml total of EBM.  Mom denies any questions.  More storage bottles provided and washing bin for pump parts.   Encouraged STS as much as possible, offering the breast with cues for 8-12 feedings per 24 hrs.    Mom knows to ask for help prn.  Lactation Tools Discussed/Used Tools: Pump;Bottle Breast pump type: Double-Electric Breast Pump Pump Education: Setup, frequency, and cleaning Reason for Pumping: Support milk supply/LPTI Pumping frequency: Q 3hrs Pumped volume: 45 mL  Interventions Interventions: Breast feeding basics reviewed;Skin to skin;Breast massage;Hand express;DEBP  Consult Status Consult Status: Follow-up Date: 03/03/21 Follow-up type: In-patient    Judee Clara 03/02/2021, 2:14 PM

## 2021-03-03 LAB — SURGICAL PATHOLOGY

## 2021-03-03 NOTE — Discharge Summary (Signed)
Postpartum Discharge Summary      Patient Name: Mary Mcpherson DOB: Nov 29, 1985 MRN: [id number]  Date of admission: 02/28/2021 Delivery date:02/28/2021  Delivering provider: Janyth Pupa  Date of discharge: 03/03/2021  Admitting diagnosis: Pregnancy [Z34.90] Preterm premature rupture of membranes (PPROM) with unknown onset of labor [O42.919] Intrauterine pregnancy: [redacted]w[redacted]d    Secondary diagnosis:  Active Problems:   Pregnancy   Preterm premature rupture of membranes (PPROM) with unknown onset of labor  Additional problems: low lying placenta    Discharge diagnosis: Term Pregnancy Delivered                                              Post partum procedures:postpartum tubal ligation Augmentation: N/A Complications: None  Hospital course: Onset of Labor With Unplanned C/S   35y.o. yo [id number] 352w6das admitted in Latent Labor on 02/28/2021. Patient had a labor course significant for PPROM. The patient went for cesarean section due to  low lying placenta . Delivery details as follows: Membrane Rupture Time/Date: 1:00 PM ,02/28/2021   Delivery Method:C-Section, Low Transverse  Details of operation can be found in separate operative note. Patient had an uncomplicated postpartum course.  Hgb on POD#1 was 10.5 and was 11.7 pre-op. She is ambulating,tolerating a regular diet, passing flatus, and urinating well.  She had in initially expressed an interest in going home on 03/02/21 however her infant needed to stay an additional day; patient is discharged home in stable condition 03/03/21.  Newborn Data: Birth date:02/28/2021  Birth time:9:17 PM  Gender:Female  Living status:Living  Apgars:7 ,8  Weight:2892 g (6lb 6oz)  Magnesium Sulfate received: No BMZ received: No Rhophylac:N/A MMR:N/A T-DaP:Given prenatally Flu: No Transfusion:No  Physical exam  Vitals:   03/01/21 2047 03/02/21 1450 03/02/21 2213 03/03/21 0520  BP:  98/64 98/63 109/77  Pulse:  78 71 90  Resp:  '18  18 17  ' Temp:  98.9 F (37.2 C) 98 F (36.7 C) 98.1 F (36.7 C)  TempSrc:  Oral Oral Oral  SpO2: 96% 97%  100%   General: alert, cooperative, and no distress Lochia: appropriate Uterine Fundus: firm Incision: Healing well with no significant drainage, No significant erythema, Dressing is clean, dry, and intact DVT Evaluation: No evidence of DVT seen on physical exam. Labs: Lab Results  Component Value Date   WBC 12.6 (H) 03/01/2021   HGB 10.5 (L) 03/01/2021   HCT 31.4 (L) 03/01/2021   MCV 93.2 03/01/2021   PLT 231 03/01/2021   CMP Latest Ref Rng & Units 07/03/2018  Glucose 65 - 99 mg/dL 87  BUN 7 - 25 mg/dL 11  Creatinine 0.50 - 1.10 mg/dL 0.74  Sodium 135 - 146 mmol/L 141  Potassium 3.5 - 5.3 mmol/L 4.1  Chloride 98 - 110 mmol/L 108  CO2 20 - 32 mmol/L 28  Calcium 8.6 - 10.2 mg/dL 9.4  Total Protein 6.1 - 8.1 g/dL 7.2  Total Bilirubin 0.2 - 1.2 mg/dL 0.4  Alkaline Phos 33 - 115 U/L -  AST 10 - 30 U/L 16  ALT 6 - 29 U/L 15   Edinburgh Score: Edinburgh Postnatal Depression Scale Screening Tool 03/01/2021  I have been able to laugh and see the funny side of things. 0  I have looked forward with enjoyment to things. 0  I have blamed myself unnecessarily when things went wrong. 1  I have been anxious or worried for no good reason. 0  I have felt scared or panicky for no good reason. 0  Things have been getting on top of me. 0  I have been so unhappy that I have had difficulty sleeping. 0  I have felt sad or miserable. 1  I have been so unhappy that I have been crying. 1  The thought of harming myself has occurred to me. 0  Edinburgh Postnatal Depression Scale Total 3     After visit meds:  Allergies as of 03/03/2021   No Known Allergies      Medication List     STOP taking these medications    aspirin EC 81 MG tablet   ondansetron 4 MG tablet Commonly known as: Zofran       TAKE these medications    ibuprofen 600 MG tablet Commonly known as:  ADVIL Take 1 tablet (600 mg total) by mouth every 6 (six) hours.   omeprazole 10 MG capsule Commonly known as: PRILOSEC Take 10 mg by mouth daily.   oxyCODONE 5 MG immediate release tablet Commonly known as: Oxy IR/ROXICODONE Take 1-2 tablets (5-10 mg total) by mouth every 4 (four) hours as needed for moderate pain.   PRENATAL VITAMINS PO Take by mouth.        Discharge home in stable condition Infant Feeding: Breast Infant Disposition:home with mother Discharge instruction: per After Visit Summary and Postpartum booklet. Activity: Advance as tolerated. Pelvic rest for 6 weeks.  Diet: routine diet Future Appointments: Future Appointments  Date Time Provider [place]  03/09/2021  3:50 PM Radene Gunning, MD [facility]  03/30/2021  1:30 PM Gala Romney Fredderick Phenix, MD [facility]   Follow up Visit:   Please schedule this patient for a In person postpartum visit in 4 weeks with the following provider: Any provider. Additional Postpartum F/U:Incision check 1 week  High risk pregnancy complicated by:  placenta previa Delivery mode:  C-Section, Low Transverse  Anticipated Birth Control:  BTL done PP   03/03/2021 Myrtis Ser, CNM 1:02 PM

## 2021-03-03 NOTE — Lactation Note (Signed)
This note was copied from a baby's chart. Lactation Consultation Note  Patient Name: Mary Mcpherson [id number] Date: 03/03/2021 Reason for consult: Follow-up assessment;Early term 41-38.6wks Age:36 hours   P4 mother whose infant is now 13 hours old.  This is an LPTI at 36+6 weeks.  Mother has breast feeding experience with all her other children.  She breast fed her last child (now 71 years old) for 4 months.  Reviewed current feeding plan.  Mother has been breast feeding, pumping and supplementing with her own EBM.  At the last pumping session she was able to obtain 38 mls of EBM.  Praised mother for her continued effort with breast feeding and pumping.  Established feeding plan for after discharge.  Mother has been using EBM/coconut oil for nipple care; provided comfort gels for further relief.  She will continue to feed at least every three hours or sooner if baby shows cues and supplementing with EBM.  Discussed our OP services to include the OP visits and breast feeding support group if desired.    Mother has a DEBP for home use.  Answered all questions.  Father present and supportive.  Discussed milk storage per family's request.     Maternal Data Has patient been taught Hand Expression?: Yes Does the patient have breastfeeding experience prior to this delivery?: Yes How long did the patient breastfeed?: 4 months with her last child (now 11 years old)  Feeding Mother's Current Feeding Choice: Breast Milk Nipple Type: Slow - flow  LATCH Score                    Lactation Tools Discussed/Used Reason for Pumping: Breast stimulation for supplementation Pumping frequency: Every three hours  Interventions    Discharge Discharge Education: Engorgement and breast care;Outpatient recommendation (As needed) Pump: Personal WIC Program: No  Consult Status Consult Status: Complete Date: 03/03/21 Follow-up type: In-patient    Cleola Perryman R Providencia Hottenstein 03/03/2021, 9:07  AM

## 2021-03-04 LAB — BPAM RBC
Blood Product Expiration Date: 202211192359
Blood Product Expiration Date: 202211192359
Unit Type and Rh: 5100
Unit Type and Rh: 5100

## 2021-03-04 LAB — TYPE AND SCREEN
ABO/RH(D): O POS
Antibody Screen: NEGATIVE
Unit division: 0
Unit division: 0

## 2021-03-05 ENCOUNTER — Encounter: Payer: BC Managed Care – PPO | Admitting: Obstetrics & Gynecology

## 2021-03-05 MED FILL — Sodium Chloride IV Soln 0.9%: INTRAVENOUS | Qty: 1000 | Status: AC

## 2021-03-05 MED FILL — Heparin Sodium (Porcine) Inj 1000 Unit/ML: INTRAMUSCULAR | Qty: 30 | Status: AC

## 2021-03-09 ENCOUNTER — Ambulatory Visit: Payer: BC Managed Care – PPO | Admitting: Obstetrics and Gynecology

## 2021-03-09 ENCOUNTER — Encounter (HOSPITAL_COMMUNITY)
Admission: RE | Admit: 2021-03-09 | Discharge: 2021-03-09 | Disposition: A | Discharge status: Home / Self Care | Payer: BC Managed Care – PPO | Source: Ambulatory Visit | Attending: Family Medicine | Admitting: Family Medicine

## 2021-03-10 ENCOUNTER — Telehealth (HOSPITAL_COMMUNITY): Payer: Self-pay

## 2021-03-10 ENCOUNTER — Ambulatory Visit (INDEPENDENT_AMBULATORY_CARE_PROVIDER_SITE_OTHER): Payer: BC Managed Care – PPO | Admitting: Advanced Practice Midwife

## 2021-03-10 ENCOUNTER — Other Ambulatory Visit: Payer: Self-pay

## 2021-03-10 ENCOUNTER — Encounter: Payer: Self-pay | Admitting: Advanced Practice Midwife

## 2021-03-10 VITALS — BP 107/71 | HR 92 | Resp 16 | Ht 67.0 in | Wt 177.0 lb

## 2021-03-10 DIAGNOSIS — Z4889 Encounter for other specified surgical aftercare: Secondary | ICD-10-CM

## 2021-03-10 NOTE — Progress Notes (Signed)
   GYNECOLOGY PROGRESS NOTE  History:  35 y.o. [id number] presents to [facility] office today for incision check visit following RLTCS with BTL on 02/28/21. She reports mild burning pain bilaterally near umbilicus, worse after increased walking last night trick or treating with her kids.  Otherwise, she is doing well postoperatively.   She denies h/a, dizziness, shortness of breath, n/v, or fever/chills.    The following portions of the patient's history were reviewed and updated as appropriate: allergies, current medications, past family history, past medical history, past social history, past surgical history and problem list. Last pap smear on 08/06/20 was normal, neg HRHPV.  Health Maintenance Due  Topic Date Due   COVID-19 Vaccine (1) Never done     Review of Systems:  Pertinent items are noted in HPI.   Objective:  Physical Exam Blood pressure 107/71, pulse 92, resp. rate 16, height 5\' 7"  (1.702 m), weight 177 lb (80.3 kg), last menstrual period 06/15/2020, currently breastfeeding. VS reviewed, nursing note reviewed,  Constitutional: well developed, well nourished, no distress HEENT: normocephalic CV: normal rate Pulm/chest wall: normal effort Breast Exam: deferred Abdomen: soft, incision well approximated, no edema, erythema, or exudate Neuro: alert and oriented x 3 Skin: warm, dry Psych: affect normal Pelvic exam: Deferred  Assessment & Plan:  1. Encounter for postoperative wound check --Doing well, some burning related to BTL, should improve. Take ibuprofen PRN, follow up at next scheduled PP visit if not improved. --Steristrips removed during today's exam and incision is healing well --Keep scheduled PP visit  08/13/2020, CNM 1:13 PM

## 2021-03-10 NOTE — Telephone Encounter (Signed)
  No answer. Mailbox is full.   Marcelino Duster [facility] 03/10/2021,1945

## 2021-03-30 ENCOUNTER — Ambulatory Visit: Payer: BC Managed Care – PPO | Admitting: Obstetrics & Gynecology

## 2021-03-30 NOTE — Progress Notes (Signed)
Post Partum Visit Note  Mary Mcpherson is a 35 y.o. [phone] female who presents for a postpartum visit. She is 4 weeks postpartum following a repeat cesarean section.  I have fully reviewed the prenatal and intrapartum course. The delivery was at 36.6 gestational weeks.  Anesthesia: spinal. Postpartum course has been unremarkable. Baby is doing well. Baby is feeding by breast. Bleeding no bleeding. Bowel function is normal. Bladder function is normal. Patient is not sexually active. Contraception method is tubal ligation. Postpartum depression screening: positive.   The pregnancy intention screening data noted above was reviewed. Potential methods of contraception were discussed. The patient elected to proceed with No data recorded.    Health Maintenance Due  Topic Date Due   COVID-19 Vaccine (1) Never done    The following portions of the patient's history were reviewed and updated as appropriate: allergies, current medications, past family history, past medical history, past social history, past surgical history, and problem list.  Review of Systems Pertinent items are noted in HPI.  Objective:  LMP 06/15/2020    General:  alert and cooperative  Lungs: clear to auscultation bilaterally  Heart:  regular rate and rhythm, S1, S2 normal, no murmur, click, rub or gallop  Abdomen: soft, non-tender; bowel sounds normal; no masses,  no organomegaly   Wound well approximated incision  GU exam:  not indicated       Assessment:    There are no diagnoses linked to this encounter.  Normal postpartum exam.  Denies s/s of suicidal or homicidal thoughts. Feels good most days. Has days where she feels overwhelmed, irritated and angry. Recommended small goes of 30 mins alone several days per week. Inform partner of goals. If she decides she wants to trial therapy and / or medication I am happy to arrange this for her.   Plan:   Essential components of care per ACOG recommendations:  1.   Mood and well being: Patient with negative depression screening today. Reviewed local resources for support.  - Patient tobacco use? No.   - hx of drug use? No.    2. Infant care and feeding:  -Patient currently breastmilk feeding? Yes. Discussed returning to work and pumping.  -Social determinants of health (SDOH) reviewed in EPIC. 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   3. Sexuality, contraception and birth spacing - Patient does not want a pregnancy in the next year.  Desired family size is 3 children.  - Reviewed forms of contraception in tiered fashion. Patient desired bilateral tubal ligation today.   - Discussed birth spacing of 18 months  4. Sleep and fatigue -Encouraged family/partner/community support of 4 hrs of uninterrupted sleep to help with mood and fatigue  5. Physical Recovery  - Discussed patients delivery and complications. She describes her labor as good. - Patient had a C-section.  - Patient has urinary incontinence? No. - Patient is safe to resume physical and sexual activity  6.  Health Maintenance - HM due items addressed Yes - Last pap smear  Diagnosis  Date Value Ref Range Status  08/07/2019   Final   - Negative for intraepithelial lesion or malignancy (NILM)   Pap smear not done at today's visit.  -Breast Cancer screening indicated? No.   7. Chronic Disease/Pregnancy Condition follow up: None  - PCP follow up  Duane Lope, NP 03/31/2021 11:05 AM   Deanna Jake Church, CMA [facility], [facility]

## 2021-03-31 ENCOUNTER — Other Ambulatory Visit: Payer: Self-pay

## 2021-03-31 ENCOUNTER — Ambulatory Visit (INDEPENDENT_AMBULATORY_CARE_PROVIDER_SITE_OTHER): Payer: BC Managed Care – PPO | Admitting: Obstetrics and Gynecology

## 2021-03-31 ENCOUNTER — Encounter: Payer: Self-pay | Admitting: Obstetrics and Gynecology

## 2021-03-31 MED ORDER — BENZONATATE 100 MG PO CAPS: 100.0000 mg | ORAL_CAPSULE | Freq: Three times a day (TID) | ORAL | 0 refills | Status: DC | PRN

## 2021-05-26 ENCOUNTER — Encounter: Payer: Self-pay | Admitting: *Deleted

## 2021-06-26 ENCOUNTER — Encounter: Payer: Self-pay | Admitting: Advanced Practice Midwife

## 2021-10-23 ENCOUNTER — Encounter: Payer: Self-pay | Admitting: Family Medicine

## 2021-11-03 ENCOUNTER — Encounter: Payer: Self-pay | Admitting: Physician Assistant

## 2021-11-03 ENCOUNTER — Ambulatory Visit (INDEPENDENT_AMBULATORY_CARE_PROVIDER_SITE_OTHER): Payer: Managed Care, Other (non HMO) | Admitting: Physician Assistant

## 2021-11-03 VITALS — BP 104/85 | HR 91 | Ht 67.0 in | Wt 173.1 lb

## 2021-11-03 DIAGNOSIS — R454 Irritability and anger: Secondary | ICD-10-CM | POA: Insufficient documentation

## 2021-11-03 DIAGNOSIS — F419 Anxiety disorder, unspecified: Secondary | ICD-10-CM | POA: Insufficient documentation

## 2021-11-03 DIAGNOSIS — R4589 Other symptoms and signs involving emotional state: Secondary | ICD-10-CM | POA: Diagnosis not present

## 2021-11-03 DIAGNOSIS — Z638 Other specified problems related to primary support group: Secondary | ICD-10-CM | POA: Diagnosis not present

## 2021-11-03 MED ORDER — SERTRALINE HCL 25 MG PO TABS
25.0000 mg | ORAL_TABLET | Freq: Every day | ORAL | 1 refills | Status: DC
Start: 2021-11-03 — End: 2021-12-18

## 2021-12-18 ENCOUNTER — Encounter: Payer: Self-pay | Admitting: Physician Assistant

## 2021-12-18 ENCOUNTER — Ambulatory Visit (INDEPENDENT_AMBULATORY_CARE_PROVIDER_SITE_OTHER): Payer: Managed Care, Other (non HMO) | Admitting: Physician Assistant

## 2021-12-18 VITALS — BP 117/77 | HR 100 | Ht 67.0 in | Wt 172.0 lb

## 2021-12-18 DIAGNOSIS — R4589 Other symptoms and signs involving emotional state: Secondary | ICD-10-CM | POA: Diagnosis not present

## 2021-12-18 DIAGNOSIS — Z1329 Encounter for screening for other suspected endocrine disorder: Secondary | ICD-10-CM

## 2021-12-18 DIAGNOSIS — Z638 Other specified problems related to primary support group: Secondary | ICD-10-CM

## 2021-12-18 DIAGNOSIS — R454 Irritability and anger: Secondary | ICD-10-CM | POA: Diagnosis not present

## 2021-12-18 DIAGNOSIS — Z13220 Encounter for screening for lipoid disorders: Secondary | ICD-10-CM

## 2021-12-18 DIAGNOSIS — Z131 Encounter for screening for diabetes mellitus: Secondary | ICD-10-CM

## 2021-12-18 DIAGNOSIS — Z79899 Other long term (current) drug therapy: Secondary | ICD-10-CM

## 2021-12-18 MED ORDER — SERTRALINE HCL 50 MG PO TABS: 50.0000 mg | ORAL_TABLET | Freq: Every day | ORAL | 0 refills | Status: DC

## 2021-12-18 NOTE — Progress Notes (Signed)
Established Patient Office Visit  Subjective   Patient ID: Mary Mcpherson, female    DOB: 28-Jun-1985  Age: 36 y.o. MRN: [id number]  Chief Complaint  Patient presents with   Follow-up    mood    HPI Pt is a 36 yo female who presents to the clinic to follow up after starting zoloft about 4 weeks ago. She does feel like it has helped her mood a lot. Her husband feels like her irritability and mood has been better. She feels less "emotional". She has 4 children and there is still stress at home but she likes the direction it is going. Noone called her to set up counseling appt.   ROS See HPI.    Objective:     BP 117/77   Pulse 100   Ht 5\' 7"  (1.702 m)   Wt 172 lb (78 kg)   SpO2 98%   BMI 26.94 kg/m  BP Readings from Last 3 Encounters:  12/18/21 117/77  11/03/21 104/85  03/31/21 104/68   Wt Readings from Last 3 Encounters:  12/18/21 172 lb (78 kg)  11/03/21 173 lb 1.3 oz (78.5 kg)  03/31/21 171 lb (77.6 kg)      Physical Exam Constitutional:      Appearance: Normal appearance.  HENT:     Head: Normocephalic.  Cardiovascular:     Rate and Rhythm: Normal rate.  Pulmonary:     Effort: Pulmonary effort is normal.  Neurological:     General: No focal deficit present.     Mental Status: She is alert and oriented to person, place, and time.  Psychiatric:        Mood and Affect: Mood normal.      ..    12/18/2021   10:33 AM 11/03/2021    9:26 AM 11/03/2021    9:17 AM 06/16/2018    4:17 PM 05/12/2017    7:56 AM  Depression screen PHQ 2/9  Decreased Interest 1 2 2  0 0  Down, Depressed, Hopeless 2 2 2  0 0  PHQ - 2 Score 3 4 4  0 0  Altered sleeping 3 1     Tired, decreased energy 3 2     Change in appetite 2 2     Feeling bad or failure about yourself  3 3     Trouble concentrating 2 1     Moving slowly or fidgety/restless 1 2     Suicidal thoughts 1 2     PHQ-9 Score 18 17     Difficult doing work/chores Very difficult       ..    12/18/2021   10:34 AM  11/03/2021    9:26 AM 06/16/2018    4:17 PM  GAD 7 : Generalized Anxiety Score  Nervous, Anxious, on Edge 3 2 0  Control/stop worrying 3 2 0  Worry too much - different things 3 2 0  Trouble relaxing 3 2 0  Restless 3 2 0  Easily annoyed or irritable 3 2 0  Afraid - awful might happen 3 2 0  Total GAD 7 Score 21 14 0  Anxiety Difficulty Extremely difficult Somewhat difficult Not difficult at all       Assessment & Plan:  . Mary Mcpherson was seen today for follow-up.  Diagnoses and all orders for this visit:  Difficulty controlling anger -     sertraline (ZOLOFT) 50 MG tablet; Take 1 tablet (50 mg total) by mouth daily.  Depressed mood -  sertraline (ZOLOFT) 50 MG tablet; Take 1 tablet (50 mg total) by mouth daily.  Stress due to family tension -     sertraline (ZOLOFT) 50 MG tablet; Take 1 tablet (50 mg total) by mouth daily.  Medication management -     COMPLETE METABOLIC PANEL WITH GFR -     Hemoglobin A1c -     Lipid Panel w/reflex Direct LDL -     TSH -     CBC w/Diff/Platelet  Screening for diabetes mellitus -     Hemoglobin A1c  Screening for lipid disorders -     Lipid Panel w/reflex Direct LDL  Thyroid disorder screen -     TSH   PHQ/GAD numbers are not better but patient reports to feel better Screening labs with cmp to check kidney and liver ordered Increased zoloft to 50mg  daily Number given to reach out to counseling Follow up in 3 months  Return in about 3 months (around 03/20/2022).    13/03/2022, PA-C

## 2021-12-21 ENCOUNTER — Encounter: Payer: Self-pay | Admitting: Family Medicine

## 2021-12-21 DIAGNOSIS — Z20828 Contact with and (suspected) exposure to other viral communicable diseases: Secondary | ICD-10-CM

## 2021-12-21 MED ORDER — OSELTAMIVIR PHOSPHATE 75 MG PO CAPS: 75.0000 mg | ORAL_CAPSULE | Freq: Every day | ORAL | 0 refills | Status: DC

## 2022-01-04 ENCOUNTER — Encounter: Payer: Self-pay | Admitting: Family Medicine

## 2022-01-04 ENCOUNTER — Ambulatory Visit (INDEPENDENT_AMBULATORY_CARE_PROVIDER_SITE_OTHER): Payer: Managed Care, Other (non HMO) | Admitting: Family Medicine

## 2022-01-04 DIAGNOSIS — R454 Irritability and anger: Secondary | ICD-10-CM

## 2022-01-04 DIAGNOSIS — Z638 Other specified problems related to primary support group: Secondary | ICD-10-CM | POA: Diagnosis not present

## 2022-01-04 DIAGNOSIS — R4589 Other symptoms and signs involving emotional state: Secondary | ICD-10-CM

## 2022-01-04 MED ORDER — SERTRALINE HCL 50 MG PO TABS: 50.0000 mg | ORAL_TABLET | Freq: Every day | ORAL | 3 refills | Status: DC

## 2022-01-04 NOTE — Assessment & Plan Note (Addendum)
She continues to do quite well with sertraline at current strength.  We will plan to continue.  90-month follow-up recommended

## 2022-01-04 NOTE — Progress Notes (Signed)
Mary Mcpherson - 36 y.o. female MRN [id number]  Date of birth: [birth year]  Subjective Chief Complaint  Patient presents with   Transitions Of Care    HPI Mary Mcpherson is a 36 year old female here today for follow-up visit.  She reports overall she is doing quite well at this time.  She has history of postpartum depression.  This is currently treated with sertraline.  She is doing well with this at current strength of 50 mg daily.  She denies any side effects related to the medication.  Feels like she needs to continue this at this time.  ROS:  A comprehensive ROS was completed and negative except as noted per HPI  No Known Allergies  Past Medical History:  Diagnosis Date   Anxiety    Depression    Dysplasia of cervix 2007   Migraines    Shingles 03/01/2014   VBAC, delivered 03/10/2019    Past Surgical History:  Procedure Laterality Date   CESAREAN SECTION  2006,2007   x 2   CESAREAN SECTION WITH BILATERAL TUBAL LIGATION N/A 02/28/2021   Procedure: CESAREAN SECTION WITH BILATERAL TUBAL LIGATION;  Surgeon: Myna Hidalgo, DO;  Location: [facility];  Service: Obstetrics;  Laterality: N/A;   procedure for dysplasia  2007   WISDOM TOOTH EXTRACTION  2010    Social History   Socioeconomic History   Marital status: Married    Spouse name: Not on file   Number of children: Not on file   Years of education: Not on file   Highest education level: Not on file  Occupational History   Not on file  Tobacco Use   Smoking status: Never   Smokeless tobacco: Never  Vaping Use   Vaping Use: Never used  Substance and Sexual Activity   Alcohol use: Not Currently    Alcohol/week: 1.0 standard drink of alcohol    Types: 1 Glasses of wine per week   Drug use: No   Sexual activity: Yes    Partners: Male    Birth control/protection: Surgical  Other Topics Concern   Not on file  Social History Narrative   Not on file   Social Determinants of Health   Financial Resource Strain: Not on  file  Food Insecurity: Not on file  Transportation Needs: Not on file  Physical Activity: Not on file  Stress: Not on file  Social Connections: Not on file    Family History  Problem Relation Age of Onset   Cancer Maternal Grandmother    Heart attack Other        parent   Diabetes Other        parents   Hypertension Other        paternal grandmother   Hyperlipidemia Other        paternal granmother   Heart disease Paternal Grandmother     Health Maintenance  Topic Date Due   INFLUENZA VACCINE  03/06/2022 (Originally 12/08/2021)   COVID-19 Vaccine (1) 03/06/2022 (Originally 11/09/1989)   PAP SMEAR-Modifier  08/06/2024   TETANUS/TDAP  01/09/2031   Hepatitis C Screening  Completed   HIV Screening  Completed   HPV VACCINES  Aged Out     ----------------------------------------------------------------------------------------------------------------------------------------------------------------------------------------------------------------- Physical Exam BP 92/61 (BP Location: Left Arm, Cuff Size: Normal)   Pulse 78   Resp 20   Ht 5\' 7"  (1.702 m)   Wt 177 lb (80.3 kg)   SpO2 98%   BMI 27.72 kg/m   Physical Exam Constitutional:  Appearance: Normal appearance.  Musculoskeletal:     Cervical back: Neck supple.  Neurological:     Mental Status: She is alert.  Psychiatric:        Mood and Affect: Mood normal.        Behavior: Behavior normal.     ------------------------------------------------------------------------------------------------------------------------------------------------------------------------------------------------------------------- Assessment and Plan  Depressed mood She continues to do quite well with sertraline at current strength.  We will plan to continue.  36-month follow-up recommended   Meds ordered this encounter  Medications   sertraline (ZOLOFT) 50 MG tablet    Sig: Take 1 tablet (50 mg total) by mouth daily.    Dispense:   90 tablet    Refill:  3    No follow-ups on file.    This visit occurred during the SARS-CoV-2 public health emergency.  Safety protocols were in place, including screening questions prior to the visit, additional usage of staff PPE, and extensive cleaning of exam room while observing appropriate contact time as indicated for disinfecting solutions.

## 2022-01-19 IMAGING — US US MFM OB COMP +14 WKS
1 series · 13 of 28 positions shown · non-contrast
Comparison: none

[Series 1: us mfm ob comp +14 wks · 138 acquisitions, 13 frames shown]
[im 6/138]
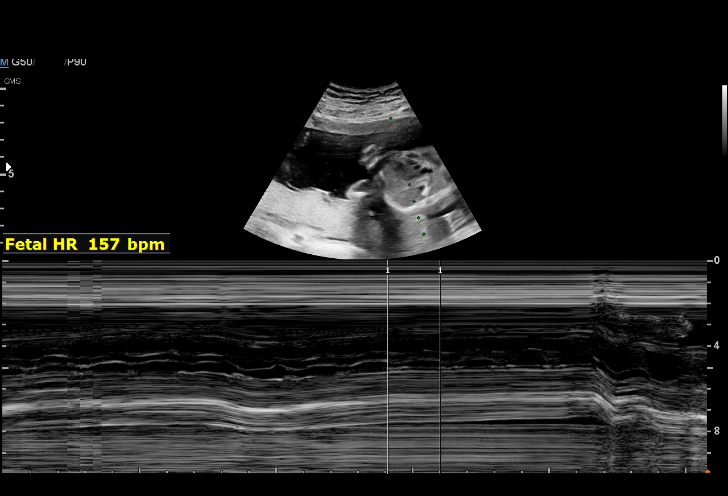
[im 16/138]
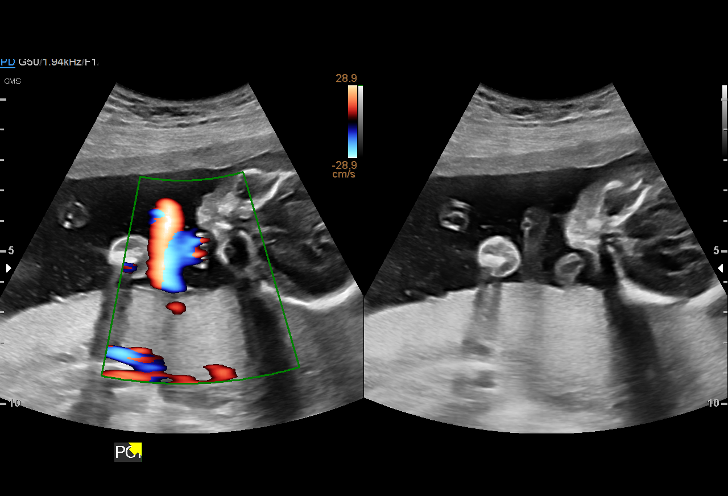
[im 26/138]
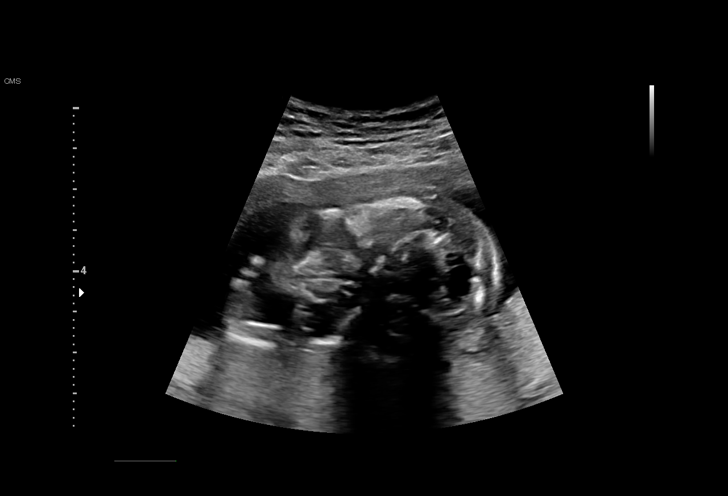
[im 36/138]
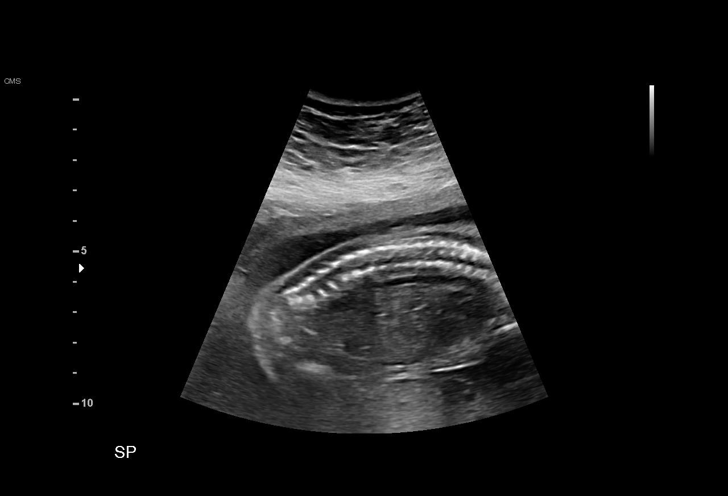
[im 46/138]
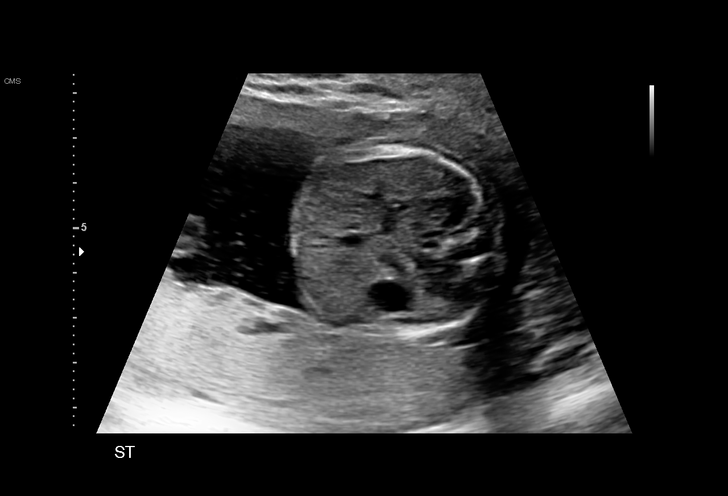
[im 56/138]
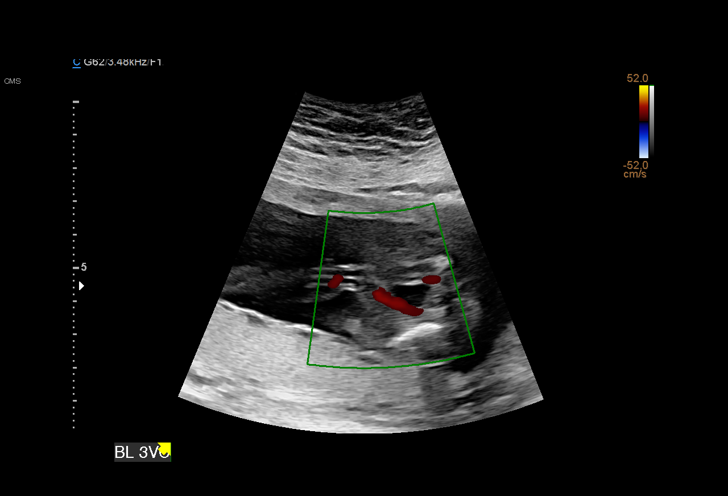
[im 72/138]
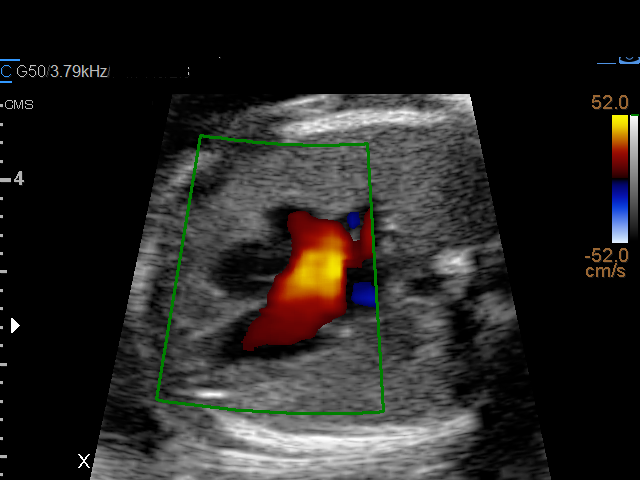
[im 82/138]
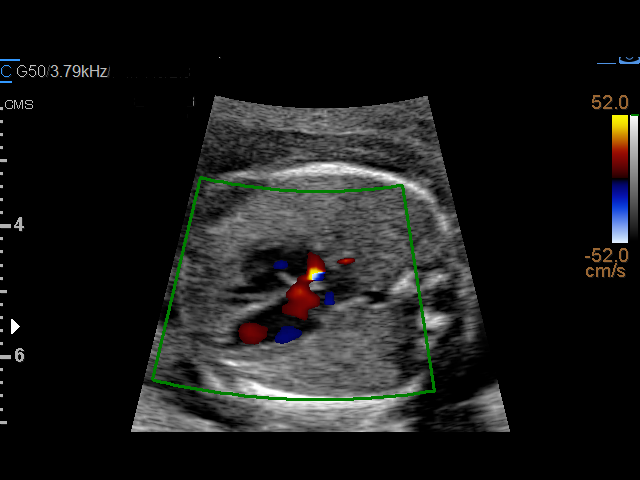
[im 92/138]
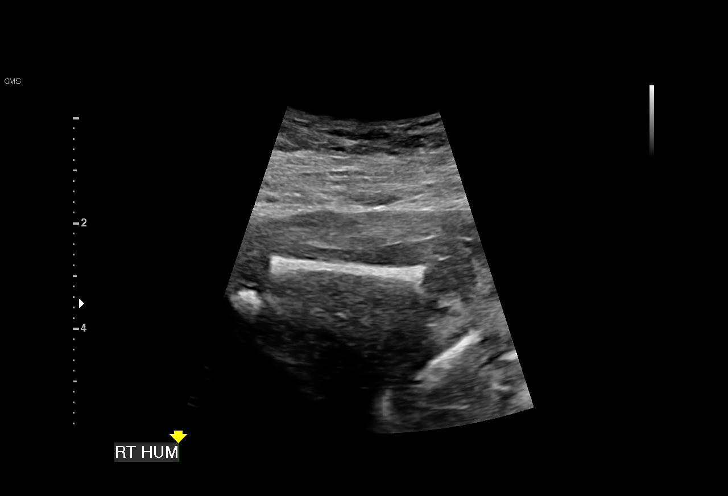
[im 102/138]
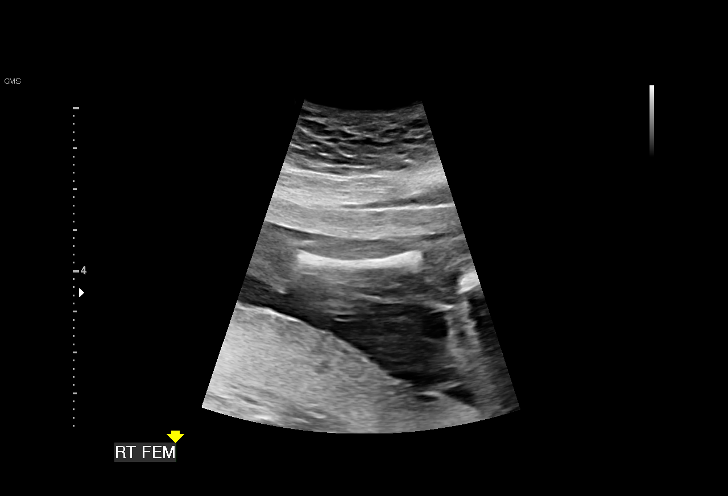
[im 112/138]
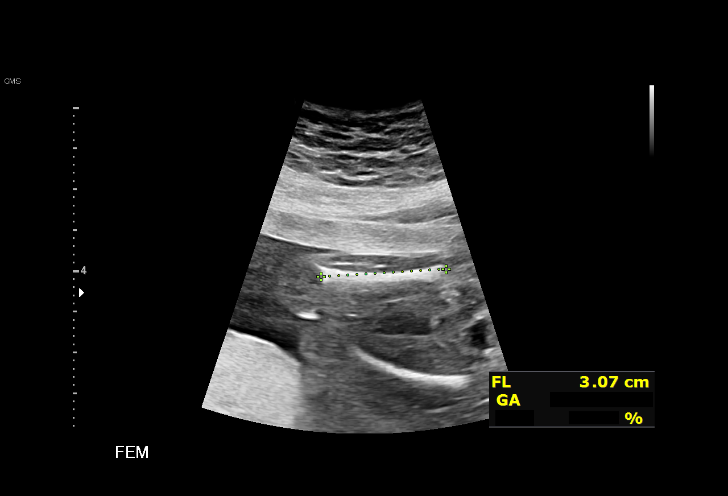
[im 122/138]
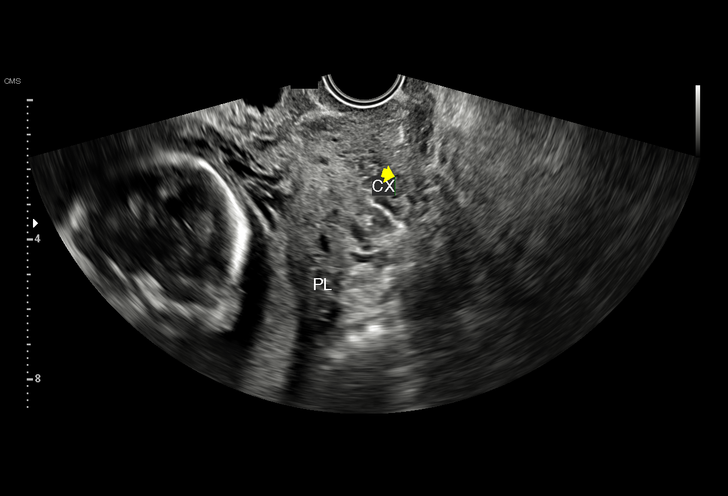
[im 132/138]
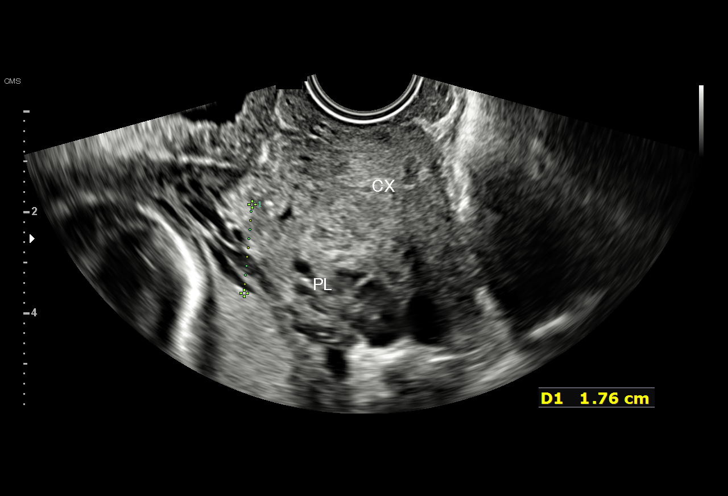

[13 of 28 positions shown; findings below may reference images not displayed]

Indications

 19 weeks gestation of pregnancy
 Advanced maternal age multigravida 35+,
 unspecified trimester
 Previous cesarean delivery, antepartum (CS
 x 2 and 1 VBAC)
 Neg Horizon
 LR Female
Fetal Evaluation

 Num Of Fetuses:         1
 Preg. Location:         Intrauterine
 Fetal Heart Rate(bpm):  157
 Cardiac Activity:       Observed
 Presentation:           Cephalic
 Placenta:               Posterior Previa
 P. Cord Insertion:      Visualized

 Amniotic Fluid
 AFI FV:      Within normal limits

                             Largest Pocket(cm)

Biometry

 BPD:      44.2  mm     G. Age:  19w 3d         60  %    CI:        67.24   %    70 - 86
                                                         FL/HC:      18.0   %    16.1 -
 HC:      172.6  mm     G. Age:  19w 6d         75  %    HC/AC:      1.11        1.09 -
 AC:      154.9  mm     G. Age:  20w 5d         89  %    FL/BPD:     70.1   %
 FL:         31  mm     G. Age:  19w 4d         61  %    FL/AC:      20.0   %    20 - 24
 HUM:      29.8  mm     G. Age:  19w 6d         67  %
 CER:      20.1  mm     G. Age:  19w 3d         53  %
 NFT:       4.9  mm

 LV:        5.7  mm
 CM:        4.3  mm

 Est. FW:     333  gm    0 lb 12 oz      93  %
OB History

 Gravidity:    4         Term:   3
Gestational Age

 LMP:           19w 1d        Date:  06/15/20                 EDD:   03/22/21
 U/S Today:     19w 6d                                        EDD:   03/17/21
 Best:          19w 1d     Det. By:  LMP  (06/15/20)          EDD:   03/22/21
Anatomy

 Cranium:               Appears normal         LVOT:                   Appears normal
 Cavum:                 Appears normal         Aortic Arch:            Appears normal
 Ventricles:            Appears normal         Ductal Arch:            Appears normal
 Choroid Plexus:        Appears normal         Diaphragm:              Appears normal
 Cerebellum:            Appears normal         Stomach:                Appears normal, left
                                                                       sided
 Posterior Fossa:       Appears normal         Abdomen:                Appears normal
 Nuchal Fold:           Appears normal         Abdominal Wall:         Appears nml (cord
                                                                       insert, abd wall)
 Face:                  Appears normal         Cord Vessels:           Appears normal (3
                        (orbits and profile)                           vessel cord)
 Lips:                  Appears normal         Kidneys:                Appear normal
 Palate:                Limited Views          Bladder:                Appears normal
 Thoracic:              Appears normal         Spine:                  Appears normal
 Heart:                 Appears normal         Upper Extremities:      Visualized
                        (4CH, axis, and
                        situs)
 RVOT:                  Appears normal         Lower Extremities:      Visualized
Targeted Anatomy

 Thorax
 3 Vessel View:         Normal                 3 V Trachea View:       Normal
Cervix Uterus Adnexa

 Cervix
 Length:           3.67  cm.
 Normal appearance by transabdominal scan.
 Uterus
 No abnormality visualized.

 Right Ovary
 Not visualized.

 Left Ovary
 Not visualized.

 Cul De Sac
 No free fluid seen.

 Adnexa
 No abnormality visualized.
Impression

 G4 P3.  Advanced maternal age.  Patient is here for fetal
 anatomy scan.
 On cell free fetal DNA screening, the risks of fetal
 aneuploidies are not increased.
 Obstetric history is significant for 2 term cesarean deliveries
 followed by 1 VBAC.
 We performed fetal anatomy scan.  Amniotic fluid is normal
 and good fetal activity seen.  No markers of aneuploidies or
 fetal structural defects was seen.  Fetal biometry is consistent
 with her previously established dates.
 Because of the appearance of placenta previa on
 transabdominal scan, we performed transvaginal ultrasound.
 The cervix measures 3.7 cm, which is within normal range.
 Bulk of the placenta is posterior and a thin edge of the
 placenta extends over the internal os (placenta previa).  Color
 Doppler for showed no evidence of accreta.
 Patient does not give history of vaginal bleeding.
 I explained the finding of placenta previa that could resolve
 with advancing gestation.
Recommendations

 -An appointment was made for her to return in 4 weeks for
 fetal growth assessment.
 -Transvaginal ultrasound to rule out placenta previa or
 placenta accreta spectrum.
                 Alberts, Charllote

## 2022-02-27 IMAGING — US US MFM OB FOLLOW-UP
1 series · 13 of 28 positions shown · non-contrast
Comparison: none

[Series 1: us mfm ob follow-up · 62 acquisitions, 13 frames shown]
[im 3/62]
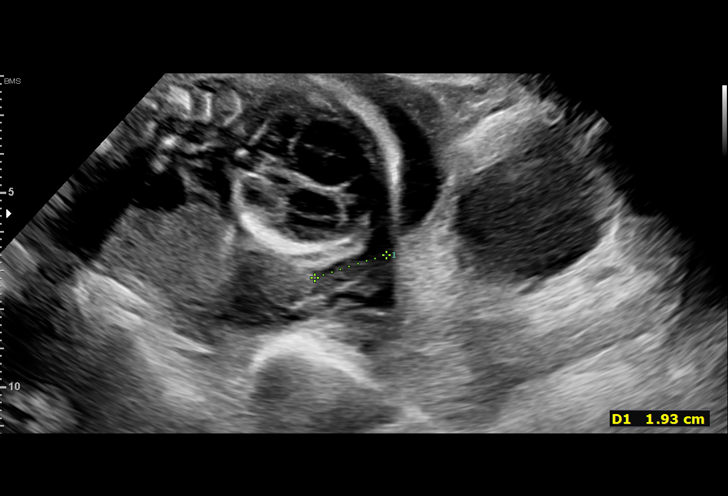
[im 7/62]
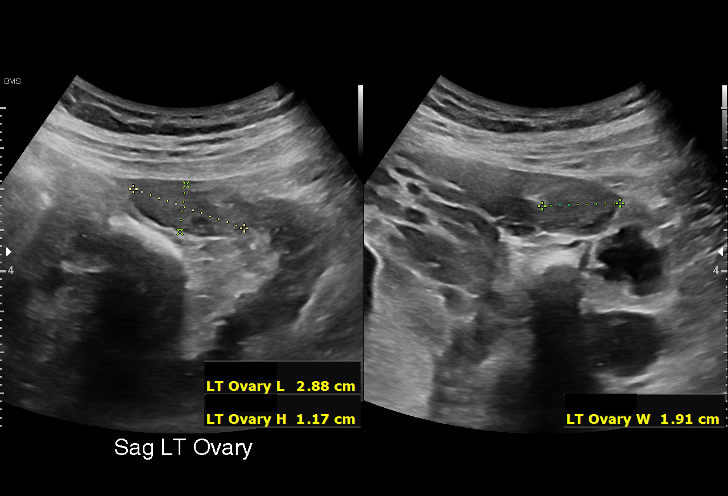
[im 12/62]
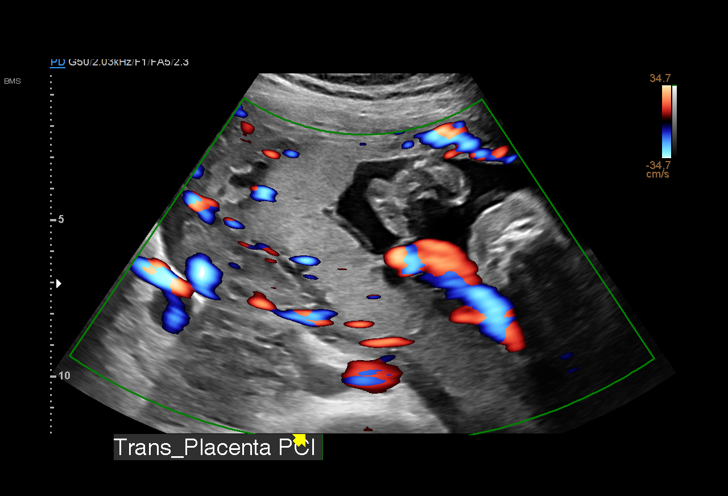
[im 16/62]
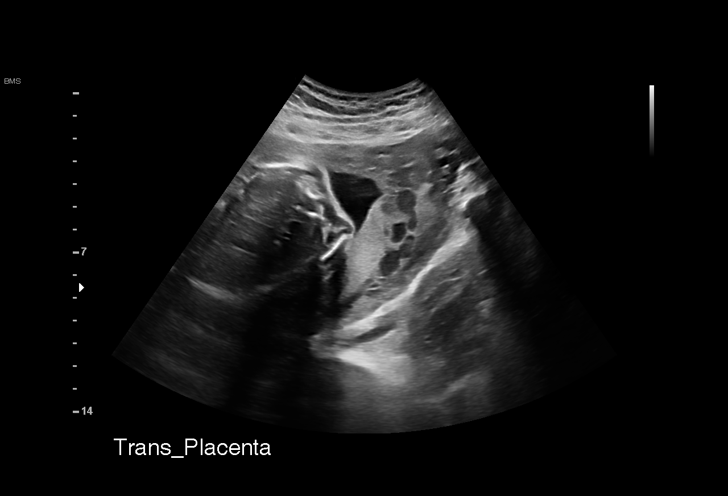
[im 21/62]
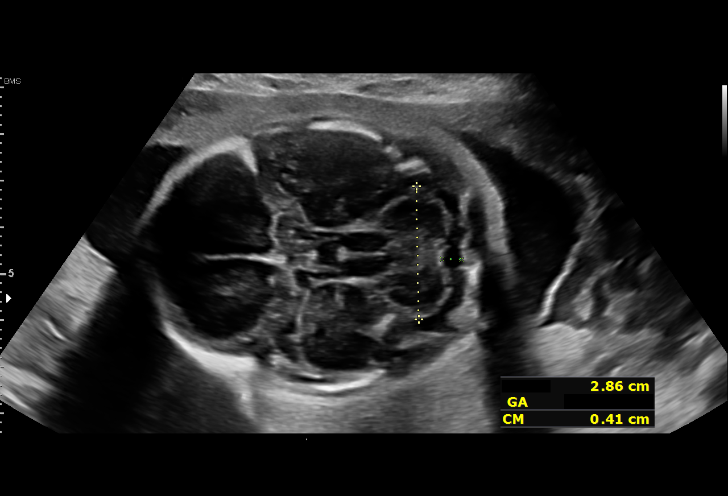
[im 25/62]
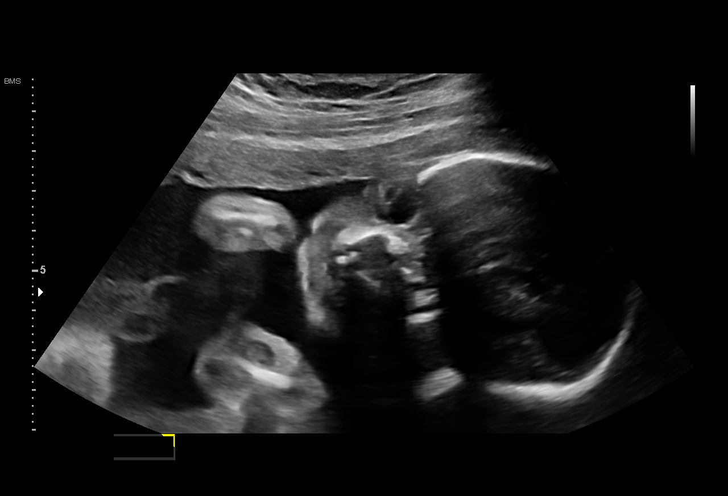
[im 32/62]
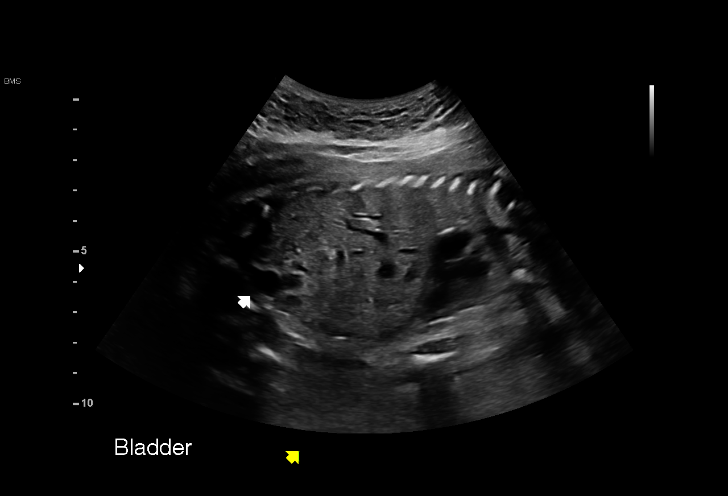
[im 37/62]
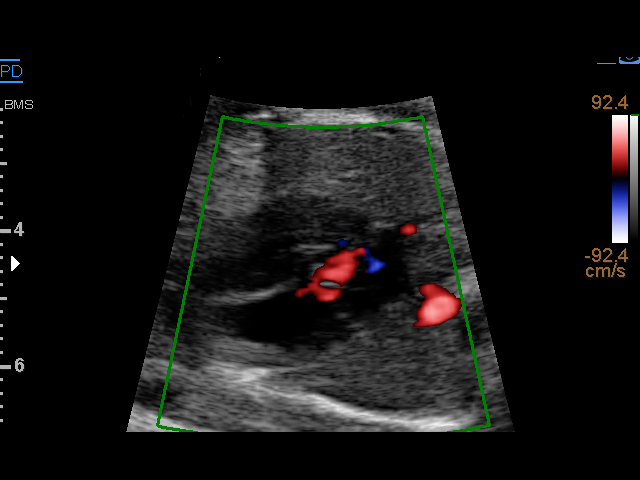
[im 41/62]
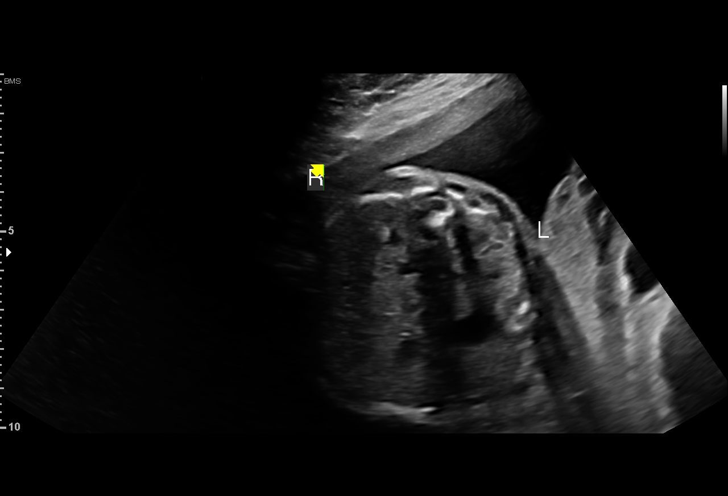
[im 46/62]
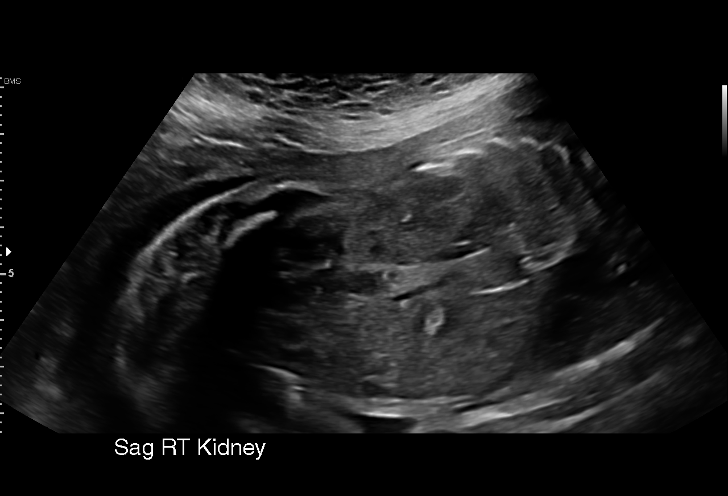
[im 50/62]
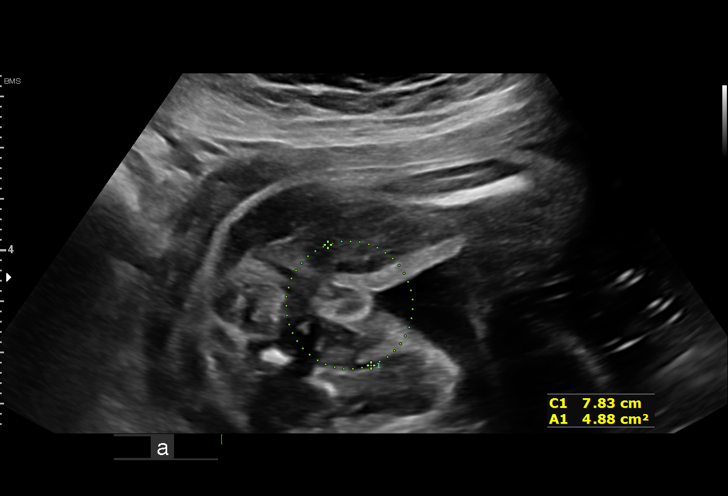
[im 55/62]
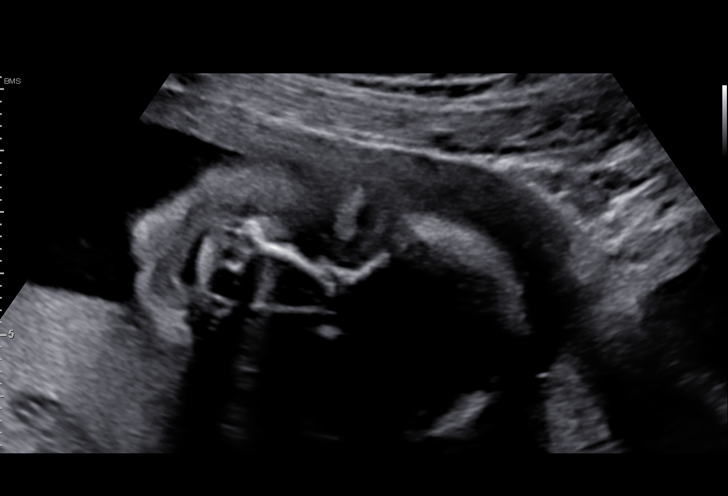
[im 59/62]
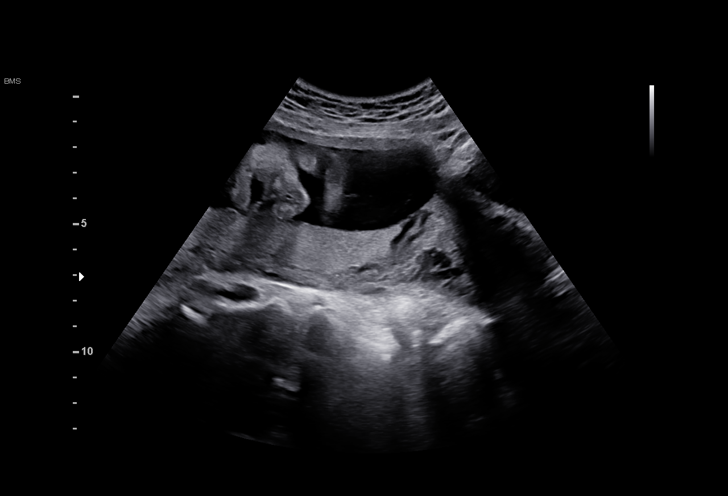

[13 of 28 positions shown; findings below may reference images not displayed]

Indications

 Placenta previa specified as without
 hemorrhage, second trimester
 Advanced maternal age multigravida 35+,
 second trimester
 Previous cesarean delivery, antepartum (CS
 x 2 and 1 VBAC)
 Neg Horizon
 LR Female
 24 weeks gestation of pregnancy
Fetal Evaluation

 Num Of Fetuses:         1
 Fetal Heart Rate(bpm):  136
 Cardiac Activity:       Observed
 Presentation:           Cephalic
 Placenta:               Posterior Previa
 P. Cord Insertion:      Visualized, central

 Amniotic Fluid
 AFI FV:      Within normal limits

                             Largest Pocket(cm)


 Comment:    Posterior Previa 1.93cm from internal os
Biometry
 BPD:      59.9  mm     G. Age:  24w 3d         32  %    CI:        69.38   %    70 - 86
                                                         FL/HC:      19.0   %    18.7 -
 HC:      229.6  mm     G. Age:  25w 0d         39  %    HC/AC:      1.12        1.04 -
 AC:      205.2  mm     G. Age:  25w 1d         54  %    FL/BPD:     73.0   %    71 - 87
 FL:       43.7  mm     G. Age:  24w 2d         26  %    FL/AC:      21.3   %    20 - 24
 HUM:        41  mm     G. Age:  24w 6d         45  %
 CER:      28.6  mm     G. Age:  25w 2d         70  %
 LV:        6.5  mm
 CM:        4.1  mm
 Est. FW:     734  gm    1 lb 10 oz      43  %
OB History

 Gravidity:    4         Term:   3
Gestational Age

 LMP:           24w 5d        Date:  06/15/20                 EDD:   03/22/21
 U/S Today:     24w 5d                                        EDD:   03/22/21
 Best:          24w 5d     Det. By:  LMP  (06/15/20)          EDD:   03/22/21
Anatomy

 Cranium:               Appears normal         LVOT:                   Previously seen
 Cavum:                 Appears normal         Aortic Arch:            Previously seen
 Ventricles:            Appears normal         Ductal Arch:            Previously seen
 Choroid Plexus:        Previously seen        Diaphragm:              Appears normal
 Cerebellum:            Appears normal         Stomach:                Appears normal, left
                                                                       sided
 Posterior Fossa:       Appears normal         Abdomen:                Appears normal
 Nuchal Fold:           Previously seen        Abdominal Wall:         Previously seen
 Face:                  Orbits and profile     Cord Vessels:           Previously seen
                        previously seen
 Lips:                  Previously seen        Kidneys:                Appear normal
 Palate:                Limited Views          Bladder:                Appears normal
 Thoracic:              Appears normal         Spine:                  Previously seen
 Heart:                 Previously seen        Upper Extremities:      Visualized
 RVOT:                  Previously seen        Lower Extremities:      Visualized
Cervix Uterus Adnexa

 Cervix
 Normal appearance by transabdominal scan.

 Uterus
 No abnormality visualized.

 Right Ovary
 Within normal limits.

 Left Ovary
 Within normal limits.

 Cul De Sac
 No free fluid seen.
 Adnexa
 No adnexal mass visualized.
Comments

 This patient was seen for a follow up exam as placenta previa
 was noted during her last exam.  She has a history of two still
 present on today's exam prior cesarean deliveries and a
 VBAC.  She denies any problems since her last exam and
 denies any vaginal bleeding.
 She was informed that the fetal growth and amniotic fluid
 level appears appropriate for her gestational age.
 A placenta previa is still present on today's exam.  The
 posterior placenta appeared within normal limits and did not
 show any signs of placenta accreta.
 A follow-up exam was scheduled in 6 weeks to assess the
 placental location and fetal growth.

## 2022-03-22 ENCOUNTER — Ambulatory Visit: Payer: Managed Care, Other (non HMO) | Admitting: Physician Assistant

## 2022-03-22 ENCOUNTER — Encounter: Payer: Self-pay | Admitting: Physician Assistant

## 2022-03-22 ENCOUNTER — Ambulatory Visit (INDEPENDENT_AMBULATORY_CARE_PROVIDER_SITE_OTHER): Payer: Managed Care, Other (non HMO) | Admitting: Physician Assistant

## 2022-03-22 VITALS — BP 110/70 | HR 95 | Ht 67.0 in | Wt 175.0 lb

## 2022-03-22 DIAGNOSIS — R4589 Other symptoms and signs involving emotional state: Secondary | ICD-10-CM

## 2022-03-22 DIAGNOSIS — R454 Irritability and anger: Secondary | ICD-10-CM | POA: Diagnosis not present

## 2022-03-22 DIAGNOSIS — Z638 Other specified problems related to primary support group: Secondary | ICD-10-CM

## 2022-03-22 MED ORDER — SERTRALINE HCL 100 MG PO TABS: 100.0000 mg | ORAL_TABLET | Freq: Every day | ORAL | 1 refills | Status: DC

## 2022-03-22 NOTE — Progress Notes (Signed)
Established Patient Office Visit  Subjective   Patient ID: Mary Mcpherson, female    DOB: Jun 07, 1985  Age: 36 y.o. MRN: [id number]  Chief Complaint  Patient presents with   Follow-up    HPI Pt is a 35 yo female with history of depression, anxiety, stress, anger who presents to the clinic to discuss medications. She was doing so well on zoloft until her oldest son starting giving her and her husband more and more problems. Her son has now moved out and she worries about him all of the time. She feels like she needs to take more of the zoloft. No SI/HC.   Marland Kitchen. Active Ambulatory Problems    Diagnosis Date Noted   History of cervical dysplasia 06/22/2013   History of positive PPD 07/02/2013   Generalized anxiety disorder 03/01/2014   Migraine 04/14/2015   Left lumbar radiculitis 05/13/2020   History of VBAC 08/25/2020   AMA (advanced maternal age) multigravida 35+ 08/25/2020   Supervision of other normal pregnancy, antepartum 08/25/2020   Low lying placenta, antepartum 10/27/2020   Pregnancy 02/28/2021   Preterm premature rupture of membranes (PPROM) with unknown onset of labor 03/01/2021   History of bilateral salpingectomy 03/02/2021   Anxiety 11/03/2021   Depressed mood 11/03/2021   Resolved Ambulatory Problems    Diagnosis Date Noted   IUD (intrauterine device) in place 06/22/2013   Shingles 03/01/2014   Supervision of other normal pregnancy, antepartum 08/30/2018   Previous cesarean delivery affecting pregnancy 08/31/2018   Marginal insertion of umbilical cord affecting management of mother 11/08/2018   Supervision of high risk pregnancy in third trimester 03/09/2019   VBAC, delivered 03/10/2019   URI (upper respiratory infection) 10/24/2019   Irregular menses 05/13/2020   Difficulty controlling anger 11/03/2021   Past Medical History:  Diagnosis Date   Depression    Dysplasia of cervix 2007   Migraines      ROS See HPI.    Objective:     BP 110/70   Pulse 95    Ht 5\' 7"  (1.702 m)   Wt 175 lb (79.4 kg)   SpO2 97%   BMI 27.41 kg/m  BP Readings from Last 3 Encounters:  03/22/22 110/70  01/04/22 92/61  12/18/21 117/77   Wt Readings from Last 3 Encounters:  03/22/22 175 lb (79.4 kg)  01/04/22 177 lb (80.3 kg)  12/18/21 172 lb (78 kg)   ..    03/22/2022    9:58 AM 12/18/2021   10:33 AM 11/03/2021    9:26 AM 11/03/2021    9:17 AM 06/16/2018    4:17 PM  Depression screen PHQ 2/9  Decreased Interest 2 1 2 2  0  Down, Depressed, Hopeless 1 2 2 2  0  PHQ - 2 Score 3 3 4 4  0  Altered sleeping 2 3 1     Tired, decreased energy 3 3 2     Change in appetite 3 2 2     Feeling bad or failure about yourself  2 3 3     Trouble concentrating 1 2 1     Moving slowly or fidgety/restless 0 1 2    Suicidal thoughts 1 1 2     PHQ-9 Score 15 18 17     Difficult doing work/chores Somewhat difficult Very difficult      ..    03/22/2022    9:58 AM 12/18/2021   10:34 AM 11/03/2021    9:26 AM 06/16/2018    4:17 PM  GAD 7 : Generalized Anxiety Score  Nervous,  Anxious, on Edge 2 3 2  0  Control/stop worrying 3 3 2  0  Worry too much - different things 3 3 2  0  Trouble relaxing 3 3 2  0  Restless 2 3 2  0  Easily annoyed or irritable 2 3 2  0  Afraid - awful might happen 3 3 2  0  Total GAD 7 Score 18 21 14  0  Anxiety Difficulty Very difficult Extremely difficult Somewhat difficult Not difficult at all      Physical Exam Constitutional:      Appearance: Normal appearance.  HENT:     Head: Normocephalic.  Cardiovascular:     Rate and Rhythm: Normal rate and regular rhythm.     Pulses: Normal pulses.  Pulmonary:     Effort: Pulmonary effort is normal.  Neurological:     General: No focal deficit present.     Mental Status: She is alert and oriented to person, place, and time.  Psychiatric:        Mood and Affect: Mood normal.          Assessment & Plan:  . Delvina was seen today for follow-up.  Diagnoses and all orders for this  visit:  Difficulty controlling anger -     sertraline (ZOLOFT) 100 MG tablet; Take 1 tablet (100 mg total) by mouth daily.  Depressed mood -     sertraline (ZOLOFT) 100 MG tablet; Take 1 tablet (100 mg total) by mouth daily.  Stress due to family tension -     sertraline (ZOLOFT) 100 MG tablet; Take 1 tablet (100 mg total) by mouth daily.   PHQ/GAD numbers not to goal Discussed coping skills with new family stress Encouraged family counseling Regular exercise encouraged Increased zoloft to 100mg  daily  Follow up as needed or in 3 months    Return in about 3 months (around 06/22/2022), or if symptoms worsen or fail to improve.    , PA-C

## 2022-06-15 ENCOUNTER — Encounter: Payer: Self-pay | Admitting: Family Medicine

## 2022-06-22 ENCOUNTER — Ambulatory Visit (INDEPENDENT_AMBULATORY_CARE_PROVIDER_SITE_OTHER): Payer: Managed Care, Other (non HMO) | Admitting: Family Medicine

## 2022-06-22 ENCOUNTER — Encounter: Payer: Self-pay | Admitting: Family Medicine

## 2022-06-22 VITALS — BP 99/67 | HR 67 | Ht 67.0 in | Wt 174.0 lb

## 2022-06-22 DIAGNOSIS — F411 Generalized anxiety disorder: Secondary | ICD-10-CM

## 2022-06-22 NOTE — Assessment & Plan Note (Signed)
She is doing well with sertraline at current strength.  Recommend continuation at current strength for now.  Follow-up in 6 months or sooner if needed.

## 2022-06-22 NOTE — Progress Notes (Signed)
Mary Mcpherson - 37 y.o. female MRN [id number]  Date of birth: 1986-02-07  Subjective Chief Complaint  Patient presents with   Mood    HPI Mary Mcpherson is a 37 y.o. female here today for follow up visit.   She had noted increased stress related to problems with their son at home. Her son moved out but she spent a lot of time worrying about his wellbeing.  Seen by [facility] in November and sertraline increased.  She reports that she is doing well with the increase strength of sertraline.  Continues to worry about her son but overall feels much better.  ROS:  A comprehensive ROS was completed and negative except as noted per HPI  No Known Allergies  Past Medical History:  Diagnosis Date   Anxiety    Depression    Dysplasia of cervix 2007   Migraines    Shingles 03/01/2014   VBAC, delivered 03/10/2019    Past Surgical History:  Procedure Laterality Date   CESAREAN SECTION  2006,2007   x 2   CESAREAN SECTION WITH BILATERAL TUBAL LIGATION N/A 02/28/2021   Procedure: CESAREAN SECTION WITH BILATERAL TUBAL LIGATION;  Surgeon: Janyth Pupa, DO;  Location: [place] [place] [facility];  Service: Obstetrics;  Laterality: N/A;   procedure for dysplasia  2007   WISDOM TOOTH EXTRACTION  2010    Social History   Socioeconomic History   Marital status: Married    Spouse name: Not on file   Number of children: Not on file   Years of education: Not on file   Highest education level: Not on file  Occupational History   Not on file  Tobacco Use   Smoking status: Never   Smokeless tobacco: Never  Vaping Use   Vaping Use: Never used  Substance and Sexual Activity   Alcohol use: Not Currently    Alcohol/week: 1.0 standard drink of alcohol    Types: 1 Glasses of wine per week   Drug use: No   Sexual activity: Yes    Partners: Male    Birth control/protection: Surgical  Other Topics Concern   Not on file  Social History Narrative   Not on file   Social Determinants of Health   Financial  Resource Strain: Not on file  Food Insecurity: Not on file  Transportation Needs: Not on file  Physical Activity: Not on file  Stress: Not on file  Social Connections: Not on file    Family History  Problem Relation Age of Onset   Cancer Maternal Grandmother    Heart attack Other        parent   Diabetes Other        parents   Hypertension Other        paternal grandmother   Hyperlipidemia Other        paternal granmother   Heart disease Paternal Grandmother     Health Maintenance  Topic Date Due   INFLUENZA VACCINE  08/08/2022 (Originally 12/08/2021)   COVID-19 Vaccine (1) 06/11/2023 (Originally 11/09/1989)   PAP SMEAR-Modifier  08/06/2024   DTaP/Tdap/Td (4 - Td or Tdap) 01/09/2031   Hepatitis C Screening  Completed   HIV Screening  Completed   HPV VACCINES  Aged Out     ----------------------------------------------------------------------------------------------------------------------------------------------------------------------------------------------------------------- Physical Exam BP 99/67 (BP Location: Right Arm, Patient Position: Sitting, Cuff Size: Large)   Pulse 67   Ht 5' 7"$  (1.702 m)   Wt 174 lb (78.9 kg)   SpO2 97%   BMI 27.25 kg/m  Physical Exam Constitutional:      Appearance: Normal appearance.  HENT:     Head: Normocephalic and atraumatic.  Eyes:     General: No scleral icterus. Cardiovascular:     Rate and Rhythm: Normal rate and regular rhythm.  Pulmonary:     Effort: Pulmonary effort is normal.     Breath sounds: Normal breath sounds.  Musculoskeletal:     Cervical back: Neck supple.  Neurological:     General: No focal deficit present.     Mental Status: She is alert.  Psychiatric:        Mood and Affect: Mood normal.        Behavior: Behavior normal.      ------------------------------------------------------------------------------------------------------------------------------------------------------------------------------------------------------------------- Assessment and Plan  Generalized anxiety disorder She is doing well with sertraline at current strength.  Recommend continuation at current strength for now.  Follow-up in 6 months or sooner if needed.   No orders of the defined types were placed in this encounter.   No follow-ups on file.    This visit occurred during the SARS-CoV-2 public health emergency.  Safety protocols were in place, including screening questions prior to the visit, additional usage of staff PPE, and extensive cleaning of exam room while observing appropriate contact time as indicated for disinfecting solutions.

## 2022-10-13 ENCOUNTER — Ambulatory Visit (INDEPENDENT_AMBULATORY_CARE_PROVIDER_SITE_OTHER): Payer: Managed Care, Other (non HMO) | Admitting: Family Medicine

## 2022-10-13 ENCOUNTER — Ambulatory Visit (INDEPENDENT_AMBULATORY_CARE_PROVIDER_SITE_OTHER): Payer: Managed Care, Other (non HMO)

## 2022-10-13 VITALS — BP 119/78 | HR 71 | Ht 67.0 in | Wt 181.0 lb

## 2022-10-13 DIAGNOSIS — F419 Anxiety disorder, unspecified: Secondary | ICD-10-CM

## 2022-10-13 DIAGNOSIS — R0600 Dyspnea, unspecified: Secondary | ICD-10-CM | POA: Insufficient documentation

## 2022-10-13 DIAGNOSIS — R5383 Other fatigue: Secondary | ICD-10-CM | POA: Diagnosis not present

## 2022-10-13 DIAGNOSIS — R059 Cough, unspecified: Secondary | ICD-10-CM

## 2022-10-13 NOTE — Assessment & Plan Note (Signed)
Overall doing pretty well with sertraline at current strength.  She will let me know having new or continued worsening symptoms.

## 2022-10-13 NOTE — Assessment & Plan Note (Addendum)
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  Chest x-ray, CMP and CBC ordered.

## 2022-10-13 NOTE — Progress Notes (Signed)
Mary Mcpherson - 37 y.o. female MRN [id number]  Date of birth: [birth year]  Subjective Chief Complaint  Patient presents with   Shortness of Breath   Fatigue   Night Sweats    HPI Mary Mcpherson is a 37 y.o. female here today with complaint of dyspnea, fatigue and cough.  She has had this for couple weeks.  She reports that she cleaned out her chicken acute but did not wear a mask which she typically does.  Reports that later that evening she felt like she had "needles in her lungs".  She has been more fatigued when trying to mow her lawn.  She denies fevers but has had some night sweats.  There is some increased stress recently.  Several events going on in her life including her child's graduation recent passing of her grandmother.  She continues on Zoloft which she feels remains fairly effective for her.  ROS:  A comprehensive ROS was completed and negative except as noted per HPI  No Known Allergies  Past Medical History:  Diagnosis Date   Anxiety    Depression    Dysplasia of cervix 2007   Migraines    Shingles 03/01/2014   VBAC, delivered 03/10/2019    Past Surgical History:  Procedure Laterality Date   CESAREAN SECTION  2006,2007   x 2   CESAREAN SECTION WITH BILATERAL TUBAL LIGATION N/A 02/28/2021   Procedure: CESAREAN SECTION WITH BILATERAL TUBAL LIGATION;  Surgeon: Myna Hidalgo, DO;  Location: [facility];  Service: Obstetrics;  Laterality: N/A;   procedure for dysplasia  2007   WISDOM TOOTH EXTRACTION  2010    Social History   Socioeconomic History   Marital status: Married    Spouse name: Not on file   Number of children: Not on file   Years of education: Not on file   Highest education level: 12th grade  Occupational History   Not on file  Tobacco Use   Smoking status: Never   Smokeless tobacco: Never  Vaping Use   Vaping Use: Never used  Substance and Sexual Activity   Alcohol use: Not Currently    Alcohol/week: 1.0 standard drink of alcohol    Types:  1 Glasses of wine per week   Drug use: No   Sexual activity: Yes    Partners: Male    Birth control/protection: Surgical  Other Topics Concern   Not on file  Social History Narrative   Not on file   Social Determinants of Health   Financial Resource Strain: Low Risk  (10/13/2022)   Overall Financial Resource Strain (CARDIA)    Difficulty of Paying Living Expenses: Not hard at all  Food Insecurity: No Food Insecurity (10/13/2022)   Hunger Vital Sign    Worried About Running Out of Food in the Last Year: Never true    Ran Out of Food in the Last Year: Never true  Transportation Needs: No Transportation Needs (10/13/2022)   PRAPARE - Administrator, Civil Service (Medical): No    Lack of Transportation (Non-Medical): No  Physical Activity: Insufficiently Active (10/13/2022)   Exercise Vital Sign    Days of Exercise per Week: 2 days    Minutes of Exercise per Session: 30 min  Stress: No Stress Concern Present (10/13/2022)   [employer] of Occupational Health - Occupational Stress Questionnaire    Feeling of Stress : Only a little  Social Connections: Socially Integrated (10/13/2022)   Social Connection and Isolation Panel [NHANES]  Frequency of Communication with Friends and Family: More than three times a week    Frequency of Social Gatherings with Friends and Family: Three times a week    Attends Religious Services: More than 4 times per year    Active Member of Clubs or Organizations: Yes    Attends Banker Meetings: More than 4 times per year    Marital Status: Married    Family History  Problem Relation Age of Onset   Cancer Maternal Grandmother    Heart attack Other        parent   Diabetes Other        parents   Hypertension Other        paternal grandmother   Hyperlipidemia Other        paternal granmother   Heart disease Paternal Grandmother     Health Maintenance  Topic Date Due   COVID-19 Vaccine (1) 06/11/2023 (Originally 11/09/1989)    INFLUENZA VACCINE  12/09/2022   PAP SMEAR-Modifier  08/06/2024   DTaP/Tdap/Td (4 - Td or Tdap) 01/09/2031   Hepatitis C Screening  Completed   HIV Screening  Completed   HPV VACCINES  Aged Out     ----------------------------------------------------------------------------------------------------------------------------------------------------------------------------------------------------------------- Physical Exam BP 119/78 (BP Location: Left Arm, Patient Position: Sitting, Cuff Size: Normal)   Pulse 71   Ht 5\' 7"  (1.702 m)   Wt 181 lb (82.1 kg)   SpO2 99%   BMI 28.35 kg/m   Physical Exam Constitutional:      Appearance: Normal appearance.  HENT:     Head: Normocephalic and atraumatic.  Eyes:     General: No scleral icterus. Cardiovascular:     Rate and Rhythm: Normal rate and regular rhythm.  Pulmonary:     Effort: Pulmonary effort is normal.     Breath sounds: Normal breath sounds.  Musculoskeletal:     Cervical back: Neck supple.  Neurological:     General: No focal deficit present.     Mental Status: She is alert.  Psychiatric:        Mood and Affect: Mood normal.        Behavior: Behavior normal.     ------------------------------------------------------------------------------------------------------------------------------------------------------------------------------------------------------------------- Assessment and Plan  Dyspnea She has had dyspnea and fatigue since cleaning her chicken coop a couple weeks ago.  Chest x-ray, CMP and CBC ordered.  Anxiety Overall doing pretty well with sertraline at current strength.  She will let me know having new or continued worsening symptoms.   No orders of the defined types were placed in this encounter.   No follow-ups on file.    This visit occurred during the SARS-CoV-2 public health emergency.  Safety protocols were in place, including screening questions prior to the visit, additional usage of  staff PPE, and extensive cleaning of exam room while observing appropriate contact time as indicated for disinfecting solutions.

## 2022-10-14 LAB — COMPLETE METABOLIC PANEL WITH GFR
AG Ratio: 1.7 (calc) (ref 1.0–2.5)
ALT: 10 U/L (ref 6–29)
AST: 15 U/L (ref 10–30)
Albumin: 4.5 g/dL (ref 3.6–5.1)
Alkaline phosphatase (APISO): 76 U/L (ref 31–125)
BUN: 11 mg/dL (ref 7–25)
CO2: 25 mmol/L (ref 20–32)
Calcium: 9.3 mg/dL (ref 8.6–10.2)
Chloride: 105 mmol/L (ref 98–110)
Creat: 0.76 mg/dL (ref 0.50–0.97)
Globulin: 2.7 g/dL (calc) (ref 1.9–3.7)
Glucose, Bld: 99 mg/dL (ref 65–99)
Potassium: 3.9 mmol/L (ref 3.5–5.3)
Sodium: 140 mmol/L (ref 135–146)
Total Bilirubin: 0.4 mg/dL (ref 0.2–1.2)
Total Protein: 7.2 g/dL (ref 6.1–8.1)
eGFR: 103 mL/min/{1.73_m2} (ref >=60)

## 2022-10-14 LAB — CBC WITH DIFFERENTIAL/PLATELET
Absolute Monocytes: 332 cells/uL (ref 200–950)
Basophils Absolute: 20 cells/uL (ref 0–200)
Basophils Relative: 0.4 %
Eosinophils Absolute: 41 cells/uL (ref 15–500)
Eosinophils Relative: 0.8 %
HCT: 38.6 % (ref 35.0–45.0)
Hemoglobin: 12.9 g/dL (ref 11.7–15.5)
Lymphs Abs: 1204 cells/uL (ref 850–3900)
MCH: 30.9 pg (ref 27.0–33.0)
MCHC: 33.4 g/dL (ref 32.0–36.0)
MCV: 92.3 fL (ref 80.0–100.0)
MPV: 10.4 fL (ref 7.5–12.5)
Monocytes Relative: 6.5 %
Neutro Abs: 3504 cells/uL (ref 1500–7800)
Neutrophils Relative %: 68.7 %
Platelets: 278 10*3/uL (ref 140–400)
RBC: 4.18 10*6/uL (ref 3.80–5.10)
RDW: 12.4 % (ref 11.0–15.0)
Total Lymphocyte: 23.6 %
WBC: 5.1 10*3/uL (ref 3.8–10.8)

## 2022-10-14 LAB — TSH+FREE T4: TSH W/REFLEX TO FT4: 1.67 mIU/L

## 2022-10-18 ENCOUNTER — Other Ambulatory Visit: Payer: Self-pay | Admitting: Family Medicine

## 2022-11-18 ENCOUNTER — Encounter: Payer: Self-pay | Admitting: Family Medicine

## 2022-11-18 MED ORDER — ALBUTEROL SULFATE HFA 108 (90 BASE) MCG/ACT IN AERS: 2.0000 | INHALATION_SPRAY | Freq: Four times a day (QID) | RESPIRATORY_TRACT | 2 refills | Status: AC | PRN

## 2023-01-10 ENCOUNTER — Encounter (INDEPENDENT_AMBULATORY_CARE_PROVIDER_SITE_OTHER): Payer: Managed Care, Other (non HMO) | Admitting: Family Medicine

## 2023-01-10 DIAGNOSIS — M5416 Radiculopathy, lumbar region: Secondary | ICD-10-CM | POA: Diagnosis not present

## 2023-01-11 ENCOUNTER — Other Ambulatory Visit: Payer: Self-pay | Admitting: Physician Assistant

## 2023-01-11 DIAGNOSIS — R4589 Other symptoms and signs involving emotional state: Secondary | ICD-10-CM

## 2023-01-11 DIAGNOSIS — Z638 Other specified problems related to primary support group: Secondary | ICD-10-CM

## 2023-01-11 DIAGNOSIS — R454 Irritability and anger: Secondary | ICD-10-CM

## 2023-01-11 MED ORDER — DICLOFENAC SODIUM 75 MG PO TBEC: 75.0000 mg | DELAYED_RELEASE_TABLET | Freq: Two times a day (BID) | ORAL | 0 refills | Status: DC

## 2023-01-11 MED ORDER — CYCLOBENZAPRINE HCL 10 MG PO TABS: 10.0000 mg | ORAL_TABLET | Freq: Three times a day (TID) | ORAL | 0 refills | Status: DC | PRN

## 2023-01-11 NOTE — Telephone Encounter (Signed)
Please see the MyChart message reply(ies) for my assessment and plan.    This patient gave consent for this Medical Advice Message and is aware that it may result in a bill to their insurance company, as well as the possibility of receiving a bill for a co-payment or deductible. They are an established patient, but are not seeking medical advice exclusively about a problem treated during an in person or video visit in the last seven days. I did not recommend an in person or video visit within seven days of my reply.    I spent a total of 7 minutes cumulative time within 7 days through MyChart messaging.  Cody Matthews, DO   

## 2023-04-19 ENCOUNTER — Ambulatory Visit (INDEPENDENT_AMBULATORY_CARE_PROVIDER_SITE_OTHER): Payer: Managed Care, Other (non HMO) | Admitting: Family Medicine

## 2023-04-19 ENCOUNTER — Ambulatory Visit: Payer: Managed Care, Other (non HMO)

## 2023-04-19 VITALS — BP 104/71 | HR 105 | Ht 67.0 in | Wt 198.0 lb

## 2023-04-19 DIAGNOSIS — M7741 Metatarsalgia, right foot: Secondary | ICD-10-CM | POA: Diagnosis not present

## 2023-04-19 DIAGNOSIS — M84374A Stress fracture, right foot, initial encounter for fracture: Secondary | ICD-10-CM | POA: Insufficient documentation

## 2023-04-19 DIAGNOSIS — M79675 Pain in left toe(s): Secondary | ICD-10-CM | POA: Insufficient documentation

## 2023-04-19 DIAGNOSIS — M79672 Pain in left foot: Secondary | ICD-10-CM | POA: Diagnosis not present

## 2023-04-19 NOTE — Progress Notes (Signed)
Mary Mcpherson - 37 y.o. female MRN [id number]  Date of birth: [birth year]  Subjective Chief Complaint  Patient presents with   Foot Injury    HPI Mary Mcpherson is a 37 y.o. female here today with complaint of foot pain.  She hit her foot on the [place] in her kitchen and tripped over her dishwasher.  Injury occurred on 04/12/23.  She had a "white line" and bruising appear at the site of injury. She has been buddy taping but this does not seem be getting better.   She has tried ice and using supportive shoe for comfort.  Swelling is better but pain is still pretty significant.  ROS:  A comprehensive ROS was completed and negative except as noted per HPI  No Known Allergies  Past Medical History:  Diagnosis Date   Anxiety    Depression    Dysplasia of cervix 2007   Migraines    Shingles 03/01/2014   VBAC, delivered 03/10/2019    Past Surgical History:  Procedure Laterality Date   CESAREAN SECTION  2006,2007   x 2   CESAREAN SECTION WITH BILATERAL TUBAL LIGATION N/A 02/28/2021   Procedure: CESAREAN SECTION WITH BILATERAL TUBAL LIGATION;  Surgeon: Myna Hidalgo, DO;  Location: [facility];  Service: Obstetrics;  Laterality: N/A;   procedure for dysplasia  2007   WISDOM TOOTH EXTRACTION  2010    Social History   Socioeconomic History   Marital status: Married    Spouse name: Not on file   Number of children: Not on file   Years of education: Not on file   Highest education level: 12th grade  Occupational History   Not on file  Tobacco Use   Smoking status: Never   Smokeless tobacco: Never  Vaping Use   Vaping status: Never Used  Substance and Sexual Activity   Alcohol use: Not Currently    Alcohol/week: 1.0 standard drink of alcohol    Types: 1 Glasses of wine per week   Drug use: No   Sexual activity: Yes    Partners: Male    Birth control/protection: Surgical  Other Topics Concern   Not on file  Social History Narrative   Not on file   Social Determinants of  Health   Financial Resource Strain: Low Risk  (04/19/2023)   Overall Financial Resource Strain (CARDIA)    Difficulty of Paying Living Expenses: Not hard at all  Food Insecurity: No Food Insecurity (04/19/2023)   Hunger Vital Sign    Worried About Running Out of Food in the Last Year: Never true    Ran Out of Food in the Last Year: Never true  Transportation Needs: No Transportation Needs (04/19/2023)   PRAPARE - Administrator, Civil Service (Medical): No    Lack of Transportation (Non-Medical): No  Physical Activity: Insufficiently Active (04/19/2023)   Exercise Vital Sign    Days of Exercise per Week: 5 days    Minutes of Exercise per Session: 20 min  Stress: Stress Concern Present (04/19/2023)   [employer] of Occupational Health - Occupational Stress Questionnaire    Feeling of Stress : Rather much  Social Connections: Socially Integrated (04/19/2023)   Social Connection and Isolation Panel [NHANES]    Frequency of Communication with Friends and Family: More than three times a week    Frequency of Social Gatherings with Friends and Family: Twice a week    Attends Religious Services: More than 4 times per year    Active Member  of Clubs or Organizations: Yes    Attends Engineer, structural: More than 4 times per year    Marital Status: Married    Family History  Problem Relation Age of Onset   Cancer Maternal Grandmother    Heart attack Other        parent   Diabetes Other        parents   Hypertension Other        paternal grandmother   Hyperlipidemia Other        paternal granmother   Heart disease Paternal Grandmother     Health Maintenance  Topic Date Due   INFLUENZA VACCINE  12/09/2022   COVID-19 Vaccine (1) 06/11/2023 (Originally 11/09/1989)   Cervical Cancer Screening (HPV/Pap Cotest)  08/06/2024   DTaP/Tdap/Td (4 - Td or Tdap) 01/09/2031   Hepatitis C Screening  Completed   HIV Screening  Completed   HPV VACCINES  Aged Out      ----------------------------------------------------------------------------------------------------------------------------------------------------------------------------------------------------------------- Physical Exam BP 104/71 (BP Location: Left Arm, Patient Position: Sitting, Cuff Size: Large)   Pulse (!) 105   Ht 5\' 7"  (1.702 m)   Wt 198 lb (89.8 kg)   SpO2 98%   BMI 31.01 kg/m   Physical Exam Constitutional:      Appearance: Normal appearance.  Eyes:     General: No scleral icterus. Musculoskeletal:     Comments: Swelling of 5th phalanx on L foot.  Mild erythema without significant bruising.  She has quite a bit of tenderness along the toe and pain with all movement.    Right foot with TTP along the metatarsal heads.  No significant swelling noted.   Neurological:     Mental Status: She is alert.  Psychiatric:        Mood and Affect: Mood normal.        Behavior: Behavior normal.     ------------------------------------------------------------------------------------------------------------------------------------------------------------------------------------------------------------------- Assessment and Plan  Toe pain, left Xrays of foot ordered.  Prescribed and dispensed a post-op shoe for her.  If there is no displacement she may continue to wear post op shoe.  If there is significant angulation and/or displacement will plan to discuss with Dr. Benjamin Stain regarding reduction.    Addendum:  Per my interpretation She does have non-displaced to minimally displaced transverse fracture of the 5th distal phalanx of the L foot.  Recommend continuation of post op shoe x4 weeks.  F/u with me in 4 weeks.   Metatarsalgia of right foot TTP along metatarsal heads like due to increased pressure and weight bearing due to injury of L foot.  Adding metatarsal pad.     No orders of the defined types were placed in this encounter.   Return in about 4 weeks (around  05/17/2023) for f/u toe fracture.    This visit occurred during the SARS-CoV-2 public health emergency.  Safety protocols were in place, including screening questions prior to the visit, additional usage of staff PPE, and extensive cleaning of exam room while observing appropriate contact time as indicated for disinfecting solutions.

## 2023-04-19 NOTE — Patient Instructions (Addendum)
Try voltaren gel to R foot.   Wear post-op shoe and see me again in 4 weeks.

## 2023-04-19 NOTE — Assessment & Plan Note (Signed)
TTP along metatarsal heads like due to increased pressure and weight bearing due to injury of L foot.  Adding metatarsal pad.

## 2023-04-19 NOTE — Assessment & Plan Note (Addendum)
Xrays of foot ordered.  Prescribed and dispensed a post-op shoe for her.  If there is no displacement she may continue to wear post op shoe.  If there is significant angulation and/or displacement will plan to discuss with Dr. Benjamin Stain regarding reduction.    Addendum:  Per my interpretation She does have non-displaced to minimally displaced transverse fracture of the 5th distal phalanx of the L foot.  Recommend continuation of post op shoe x4 weeks.  F/u with me in 4 weeks.

## 2023-05-10 ENCOUNTER — Ambulatory Visit: Payer: Self-pay | Admitting: Family Medicine

## 2023-05-10 ENCOUNTER — Ambulatory Visit (HOSPITAL_BASED_OUTPATIENT_CLINIC_OR_DEPARTMENT_OTHER): Payer: Managed Care, Other (non HMO) | Admitting: Family Medicine

## 2023-05-10 NOTE — Telephone Encounter (Signed)
  Chief Complaint: right foot pain and swelling Symptoms: Great right toe numbness, redness and swelling to top of 2nd/3rd/4th toes (the size of a half dollar), pain with ambulating or standing on right foot Frequency: since 04/13/23 Pertinent Negatives: Patient denies fever, rash, pain in calf or radiating up right leg,  cool/blue/paleness to right foot or leg Disposition: [] ED /[] Urgent Care (no appt availability in office) / [x] Appointment(In office/virtual)/ []  [facility]/ [] Home Care/ [] Refused Recommended Disposition /[] [place] [facility]/ []  Follow-up with PCP Additional Notes: Pt with recent OV with PCP on 12/10 for Left foot injury and broken toe. Pt states she has been wearing a post op boot for 3 weeks on her left foot. Pt states she is also having symptoms with right foot including pain, swelling, numbness and redness. Pt states she has been treating at home with Tylenol  and Voltaren  gel. Pt agreeable to OV at [facility] for sooner availability.  Copied from CRM [id number]. Topic: Clinical - Red Word Triage >> May 10, 2023  9:04 AM [facility] ORN wrote: Red Word that prompted transfer to Nurse Triage: big toeright side is going numb, cannot put pressure on the front of foot, top of it has red circle on it and swollen. Patient is concerned because of numbness Reason for Disposition  [1] Redness of the skin AND [2] no fever  Answer Assessment - Initial Assessment Questions 1. ONSET: When did the pain start?      Dec 4th.  2. LOCATION: Where is the pain located?      Right foot- middle of foot down to toes.  3. PAIN: How bad is the pain?    (Scale 1-10; or mild, moderate, severe)  - MILD (1-3): doesn't interfere with normal activities.   - MODERATE (4-7): interferes with normal activities (e.g., work or school) or awakens from sleep, limping.   - SEVERE (8-10): excruciating pain, unable to do any normal activities, unable to walk.      1/10. Comes and goes, when sitting  it doesn't hurt. Pt states it does hurt to stand or walk when putting pressure on it.  4. WORK OR EXERCISE: Has there been any recent work or exercise that involved this part of the body?      Recent left foot injury end of November, has been wearing post op boot on left foot.  5. CAUSE: What do you think is causing the foot pain?     Unsure.  6. OTHER SYMPTOMS: Do you have any other symptoms? (e.g., leg pain, rash, fever, numbness)     Right great toe numbness, swelling puffiness with redness to 2nd, 3rd, and 4th toes of right foot.  7. PREGNANCY: Is there any chance you are pregnant? When was your last menstrual period?     LMP 05/08/23.  Protocols used: Foot Pain-A-AH

## 2023-05-17 ENCOUNTER — Encounter: Payer: Self-pay | Admitting: Family Medicine

## 2023-05-17 ENCOUNTER — Ambulatory Visit: Payer: Managed Care, Other (non HMO)

## 2023-05-17 ENCOUNTER — Ambulatory Visit (INDEPENDENT_AMBULATORY_CARE_PROVIDER_SITE_OTHER): Payer: Managed Care, Other (non HMO) | Admitting: Family Medicine

## 2023-05-17 VITALS — BP 102/70 | HR 85 | Ht 67.0 in | Wt 204.0 lb

## 2023-05-17 DIAGNOSIS — S92525D Nondisplaced fracture of medial phalanx of left lesser toe(s), subsequent encounter for fracture with routine healing: Secondary | ICD-10-CM

## 2023-05-17 DIAGNOSIS — S92505D Nondisplaced unspecified fracture of left lesser toe(s), subsequent encounter for fracture with routine healing: Secondary | ICD-10-CM | POA: Insufficient documentation

## 2023-05-17 DIAGNOSIS — M79671 Pain in right foot: Secondary | ICD-10-CM | POA: Diagnosis not present

## 2023-05-17 MED ORDER — METHYLPREDNISOLONE 4 MG PO TBPK: ORAL_TABLET | ORAL | 0 refills | Status: DC

## 2023-05-17 NOTE — Assessment & Plan Note (Signed)
 X-rays of the right foot ordered.

## 2023-05-17 NOTE — Assessment & Plan Note (Signed)
 Pain is improved overall.  No longer using postop shoe.  Repeating x-rays to ensure this is healing

## 2023-05-17 NOTE — Progress Notes (Signed)
 Mary Mcpherson - 38 y.o. female MRN [id number]  Date of birth: [birth year]  Subjective Chief Complaint  Patient presents with   Foot Injury    HPI Mary Mcpherson is a 38 y.o. female here today for follow up of toe fracture.  She reports the toe pain has improved.  She is no longer using postop shoe.  She has not noted any continued swelling or bruising.  She has also noticed some pain in the right midfoot.  She has noticed some bruising of this area as well.  Pain is worse with weightbearing.  She does not recall any injury to this foot.  ROS:  A comprehensive ROS was completed and negative except as noted per HPI    No Known Allergies  Past Medical History:  Diagnosis Date   Anxiety    Depression    Dysplasia of cervix 2007   Migraines    Shingles 03/01/2014   VBAC, delivered 03/10/2019    Past Surgical History:  Procedure Laterality Date   CESAREAN SECTION  2006,2007   x 2   CESAREAN SECTION WITH BILATERAL TUBAL LIGATION N/A 02/28/2021   Procedure: CESAREAN SECTION WITH BILATERAL TUBAL LIGATION;  Surgeon: Ozan, Jennifer, DO;  Location: [facility];  Service: Obstetrics;  Laterality: N/A;   procedure for dysplasia  2007   WISDOM TOOTH EXTRACTION  2010    Social History   Socioeconomic History   Marital status: Married    Spouse name: Not on file   Number of children: Not on file   Years of education: Not on file   Highest education level: 12th grade  Occupational History   Not on file  Tobacco Use   Smoking status: Never   Smokeless tobacco: Never  Vaping Use   Vaping status: Never Used  Substance and Sexual Activity   Alcohol use: Not Currently    Alcohol/week: 1.0 standard drink of alcohol    Types: 1 Glasses of wine per week   Drug use: No   Sexual activity: Yes    Partners: Male    Birth control/protection: Surgical  Other Topics Concern   Not on file  Social History Narrative   Not on file   Social Drivers of Health   Financial Resource Strain: Low  Risk  (04/19/2023)   Overall Financial Resource Strain (CARDIA)    Difficulty of Paying Living Expenses: Not hard at all  Food Insecurity: No Food Insecurity (04/19/2023)   Hunger Vital Sign    Worried About Running Out of Food in the Last Year: Never true    Ran Out of Food in the Last Year: Never true  Transportation Needs: No Transportation Needs (04/19/2023)   PRAPARE - Administrator, Civil Service (Medical): No    Lack of Transportation (Non-Medical): No  Physical Activity: Insufficiently Active (04/19/2023)   Exercise Vital Sign    Days of Exercise per Week: 5 days    Minutes of Exercise per Session: 20 min  Stress: Stress Concern Present (04/19/2023)   [employer] of Occupational Health - Occupational Stress Questionnaire    Feeling of Stress : Rather much  Social Connections: Socially Integrated (04/19/2023)   Social Connection and Isolation Panel [NHANES]    Frequency of Communication with Friends and Family: More than three times a week    Frequency of Social Gatherings with Friends and Family: Twice a week    Attends Religious Services: More than 4 times per year    Active Member of Clubs or  Organizations: Yes    Attends Engineer, Structural: More than 4 times per year    Marital Status: Married    Family History  Problem Relation Age of Onset   Cancer Maternal Grandmother    Heart attack Other        parent   Diabetes Other        parents   Hypertension Other        paternal grandmother   Hyperlipidemia Other        paternal granmother   Heart disease Paternal Grandmother     Health Maintenance  Topic Date Due   COVID-19 Vaccine (1) 06/11/2023 (Originally 11/09/1989)   INFLUENZA VACCINE  08/08/2023 (Originally 12/09/2022)   Cervical Cancer Screening (HPV/Pap Cotest)  08/06/2024   DTaP/Tdap/Td (4 - Td or Tdap) 01/09/2031   Hepatitis C Screening  Completed   HIV Screening  Completed   HPV VACCINES  Aged Out      ----------------------------------------------------------------------------------------------------------------------------------------------------------------------------------------------------------------- Physical Exam BP 102/70 (BP Location: Left Arm, Patient Position: Sitting, Cuff Size: Large)   Pulse 85   Ht 5' 7 (1.702 m)   Wt 204 lb (92.5 kg)   SpO2 98%   BMI 31.95 kg/m   Physical Exam Constitutional:      Appearance: Normal appearance.  HENT:     Head: Normocephalic and atraumatic.  Eyes:     General: No scleral icterus. Cardiovascular:     Rate and Rhythm: Normal rate and regular rhythm.  Pulmonary:     Effort: Pulmonary effort is normal.     Breath sounds: Normal breath sounds.  Musculoskeletal:     Cervical back: Neck supple.     Comments: Mild overlying bruising along the right midfoot.  Pain with manipulation of the tarsometatarsal joint.  Neurological:     Mental Status: She is alert.  Psychiatric:        Mood and Affect: Mood normal.        Behavior: Behavior normal.     ------------------------------------------------------------------------------------------------------------------------------------------------------------------------------------------------------------------- Assessment and Plan  Closed nondisplaced fracture of lesser toe of left foot with routine healing Pain is improved overall.  No longer using postop shoe.  Repeating x-rays to ensure this is healing  Right foot pain X-rays of the right foot ordered.   Meds ordered this encounter  Medications   methylPREDNISolone  (MEDROL  DOSEPAK) 4 MG TBPK tablet    Sig: Taper as directed on packaging    Dispense:  21 tablet    Refill:  0    No follow-ups on file.    This visit occurred during the SARS-CoV-2 public health emergency.  Safety protocols were in place, including screening questions prior to the visit, additional usage of staff PPE, and extensive cleaning of exam  room while observing appropriate contact time as indicated for disinfecting solutions.

## 2023-05-18 NOTE — Telephone Encounter (Signed)
 Patient scheduled.

## 2023-05-20 ENCOUNTER — Encounter: Payer: Self-pay | Admitting: Sports Medicine

## 2023-05-20 ENCOUNTER — Ambulatory Visit (INDEPENDENT_AMBULATORY_CARE_PROVIDER_SITE_OTHER): Payer: Managed Care, Other (non HMO) | Admitting: Sports Medicine

## 2023-05-20 DIAGNOSIS — M84374D Stress fracture, right foot, subsequent encounter for fracture with routine healing: Secondary | ICD-10-CM

## 2023-05-20 DIAGNOSIS — M79675 Pain in left toe(s): Secondary | ICD-10-CM

## 2023-05-20 MED ORDER — CALCIUM 600+D PLUS MINERALS 600-400 MG-UNIT PO TABS: ORAL_TABLET | ORAL | 3 refills | Status: AC

## 2023-05-20 NOTE — Assessment & Plan Note (Signed)
 6 weeks of pain right third metatarsal neck, stress reaction noted on x-rays. Switching postop shoe to the right side, adding calcium  and vitamin D  supplementation, return in 6 weeks, patient understands it can take 6 to 12 weeks of treatment to make this better. If no improvement by the follow-up in 6 weeks we will consider cast immobilization.

## 2023-05-20 NOTE — Progress Notes (Signed)
    Procedures performed today:    None.  Independent interpretation of notes and tests performed by another provider:   X-rays personally reviewed, I do see periosteal reaction third metatarsal neck  Brief History, Exam, Impression, and Recommendations:    Stress fracture of metatarsal bone of right foot 6 weeks of pain right third metatarsal neck, stress reaction noted on x-rays. Switching postop shoe to the right side, adding calcium  and vitamin D  supplementation, return in 6 weeks, patient understands it can take 6 to 12 weeks of treatment to make this better. If no improvement by the follow-up in 6 weeks we will consider cast immobilization.  Toe pain, left Pain left fifth toe, minimal tenderness still present, we can transition to buddy taping here.    ____________________________________________ Debby PARAS. Curtis, M.D., ABFM., CAQSM., AME. Primary Care and Sports Medicine [facility]  Adjunct Professor of [facility] Medicine  [employer]  [employer], [employer]

## 2023-05-20 NOTE — Assessment & Plan Note (Signed)
 Pain left fifth toe, minimal tenderness still present, we can transition to buddy taping here.

## 2023-07-01 ENCOUNTER — Ambulatory Visit: Payer: Managed Care, Other (non HMO)

## 2023-07-01 ENCOUNTER — Ambulatory Visit (INDEPENDENT_AMBULATORY_CARE_PROVIDER_SITE_OTHER): Payer: Managed Care, Other (non HMO) | Admitting: Sports Medicine

## 2023-07-01 DIAGNOSIS — M5416 Radiculopathy, lumbar region: Secondary | ICD-10-CM | POA: Diagnosis not present

## 2023-07-01 DIAGNOSIS — M84374G Stress fracture, right foot, subsequent encounter for fracture with delayed healing: Secondary | ICD-10-CM

## 2023-07-01 DIAGNOSIS — M84374D Stress fracture, right foot, subsequent encounter for fracture with routine healing: Secondary | ICD-10-CM | POA: Diagnosis not present

## 2023-07-01 NOTE — Assessment & Plan Note (Signed)
Known multilevel lumbar DDD, we last evaluated this about 3 years ago, at the time she was having severe left-sided S1 distribution radiculopathy, MRI did show multilevel disc protrusions, she was lost to follow-up. Today she complains of persistent numbness right great toe and left foot, at this point she can live with it but if this becomes intolerable she is a candidate for epidurals.

## 2023-07-01 NOTE — Progress Notes (Signed)
    Procedures performed today:    None.  Independent interpretation of notes and tests performed by another provider:   I did personally review the x-rays which show increased bony callus, I can see the fracture line quite clearly.  Brief History, Exam, Impression, and Recommendations:    Stress fracture of metatarsal bone of right foot Mary Mcpherson returns, she is a very pleasant 38 year old female, I saw her back in January after about 6 weeks of pain right third metatarsal neck, ultimately show stress reaction was noted on x-rays. We switched her to a postop shoe, we added calcium and vitamin D supplementation. I advised her this can take 6 to 12 weeks to get better, it does not look like she wear the postop shoe for a full 6 weeks, she unfortunately still has discomfort at the third metatarsal neck. I did personally review the x-rays which show increased bony callus, I can see the fracture line quite clearly. As she is not healed she absolutely needs to return to the postop shoe for at least 6 more weeks, continue calcium and vitamin D supplementation, I will see her back in about 6 weeks, if persistent pain we will consider a bone stimulator +/- a fiberglass cast.   Left lumbar radiculitis Known multilevel lumbar DDD, we last evaluated this about 3 years ago, at the time she was having severe left-sided S1 distribution radiculopathy, MRI did show multilevel disc protrusions, she was lost to follow-up. Today she complains of persistent numbness right great toe and left foot, at this point she can live with it but if this becomes intolerable she is a candidate for epidurals.    ____________________________________________ Mary Mcpherson. Mary Mcpherson, M.D., ABFM., CAQSM., AME. Primary Care and Sports Medicine [facility]  Adjunct Professor of Family Medicine  [place] [employer] [facility] [employer] [facility] [employer]  Restaurant manager, fast food

## 2023-07-01 NOTE — Assessment & Plan Note (Signed)
Mary Mcpherson returns, she is a very pleasant 38 year old female, I saw her back in January after about 6 weeks of pain right third metatarsal neck, ultimately show stress reaction was noted on x-rays. We switched her to a postop shoe, we added calcium and vitamin D supplementation. I advised her this can take 6 to 12 weeks to get better, it does not look like she wear the postop shoe for a full 6 weeks, she unfortunately still has discomfort at the third metatarsal neck. I did personally review the x-rays which show increased bony callus, I can see the fracture line quite clearly. As she is not healed she absolutely needs to return to the postop shoe for at least 6 more weeks, continue calcium and vitamin D supplementation, I will see her back in about 6 weeks, if persistent pain we will consider a bone stimulator +/- a fiberglass cast.

## 2023-08-08 ENCOUNTER — Encounter: Payer: Self-pay | Admitting: Sports Medicine

## 2023-08-08 ENCOUNTER — Ambulatory Visit (INDEPENDENT_AMBULATORY_CARE_PROVIDER_SITE_OTHER): Payer: Managed Care, Other (non HMO) | Admitting: Sports Medicine

## 2023-08-08 ENCOUNTER — Ambulatory Visit

## 2023-08-08 DIAGNOSIS — M84374G Stress fracture, right foot, subsequent encounter for fracture with delayed healing: Secondary | ICD-10-CM

## 2023-08-08 DIAGNOSIS — R4589 Other symptoms and signs involving emotional state: Secondary | ICD-10-CM | POA: Diagnosis not present

## 2023-08-08 DIAGNOSIS — R454 Irritability and anger: Secondary | ICD-10-CM | POA: Diagnosis not present

## 2023-08-08 DIAGNOSIS — Z638 Other specified problems related to primary support group: Secondary | ICD-10-CM | POA: Diagnosis not present

## 2023-08-08 MED ORDER — DICLOFENAC SODIUM 75 MG PO TBEC: 75.0000 mg | DELAYED_RELEASE_TABLET | Freq: Two times a day (BID) | ORAL | 3 refills | Status: AC

## 2023-08-08 MED ORDER — SERTRALINE HCL 100 MG PO TABS: 100.0000 mg | ORAL_TABLET | Freq: Every day | ORAL | 1 refills | Status: AC

## 2023-08-08 NOTE — Assessment & Plan Note (Signed)
 Mary Mcpherson returns, she is a very pleasant 38 year old female, we saw her back in January after about 6 weeks of pain third metatarsal neck, ultimately a stress reaction was noted on x-rays, we switched to a postop shoe, added calcium vitamin D supplementation, I advised her this can take 3 months plus to get better. She had not worn the postop shoe for a full 6 weeks. She returns today, she has had intermittent wear with the postop shoe, she did have a slip and an injury and reinjured the area. She does still have tenderness at the third metatarsal neck with palpable bony callus. X-rays do show good continued bony callus without any additional fractures. I think she is getting there, this just taking longer than expected. As she really dislikes the postop shoe we will transition into a Morton's plate placed in her athletic shoes. I would like to see her back again in about 6 weeks. No further x-rays needed.

## 2023-08-08 NOTE — Progress Notes (Signed)
    Procedures performed today:    None.  Independent interpretation of notes and tests performed by another provider:   I did personally review the x-rays today, she has improved bony callus at the third metatarsal neck  Brief History, Exam, Impression, and Recommendations:    Stress fracture of metatarsal bone of right foot Mary Mcpherson returns, she is a very pleasant 38 year old female, we saw her back in January after about 6 weeks of pain third metatarsal neck, ultimately a stress reaction was noted on x-rays, we switched to a postop shoe, added calcium vitamin D supplementation, I advised her this can take 3 months plus to get better. She had not worn the postop shoe for a full 6 weeks. She returns today, she has had intermittent wear with the postop shoe, she did have a slip and an injury and reinjured the area. She does still have tenderness at the third metatarsal neck with palpable bony callus. X-rays do show good continued bony callus without any additional fractures. I think she is getting there, this just taking longer than expected. As she really dislikes the postop shoe we will transition into a Morton's plate placed in her athletic shoes. I would like to see her back again in about 6 weeks. No further x-rays needed.    ____________________________________________ [name]. Benjamin Stain, M.D., ABFM., CAQSM., AME. Primary Care and Sports Medicine [facility]  Adjunct Professor of Family Medicine  [place] [employer] [facility]  Restaurant manager, fast food

## 2023-09-05 ENCOUNTER — Ambulatory Visit (INDEPENDENT_AMBULATORY_CARE_PROVIDER_SITE_OTHER): Admitting: Sports Medicine

## 2023-09-05 ENCOUNTER — Ambulatory Visit (INDEPENDENT_AMBULATORY_CARE_PROVIDER_SITE_OTHER)

## 2023-09-05 DIAGNOSIS — M25531 Pain in right wrist: Secondary | ICD-10-CM | POA: Diagnosis not present

## 2023-09-05 DIAGNOSIS — S52514A Nondisplaced fracture of right radial styloid process, initial encounter for closed fracture: Secondary | ICD-10-CM | POA: Diagnosis not present

## 2023-09-05 DIAGNOSIS — S52513A Displaced fracture of unspecified radial styloid process, initial encounter for closed fracture: Secondary | ICD-10-CM | POA: Insufficient documentation

## 2023-09-05 DIAGNOSIS — W19XXXA Unspecified fall, initial encounter: Secondary | ICD-10-CM | POA: Diagnosis not present

## 2023-09-05 MED ORDER — IBUPROFEN 800 MG PO TABS: 800.0000 mg | ORAL_TABLET | Freq: Three times a day (TID) | ORAL | 2 refills | Status: AC | PRN

## 2023-09-05 NOTE — Assessment & Plan Note (Addendum)
 Recent fall onto outstretched hand, tender to palpation snuffbox, dorsal and volar distal radius, moderate bruising. Neurovascular intact distally. Adding an EXOS spica, x-rays, return in 3 weeks.  Update:  There is a fracture through the radius (radial styloid process).  No change in plan.  Expect 6 weeks for healing.

## 2023-09-05 NOTE — Progress Notes (Addendum)
    Procedures performed today:    None.  Independent interpretation of notes and tests performed by another provider:   X-rays personally reviewed and do show a nondisplaced fracture through the radial styloid process.  Brief History, Exam, Impression, and Recommendations:    Closed fracture of radial styloid, right Recent fall onto outstretched hand, tender to palpation snuffbox, dorsal and volar distal radius, moderate bruising. Neurovascular intact distally. Adding an EXOS spica, x-rays, return in 3 weeks.  Update:  There is a fracture through the radius (radial styloid process).  No change in plan.  Expect 6 weeks for healing.    ____________________________________________ Joselyn Nicely. Sandy Crumb, M.D., ABFM., CAQSM., AME. Primary Care and Sports Medicine [place]  Adjunct Professor of [facility] Medicine  [employer]  Restaurant manager, fast food

## 2023-09-20 ENCOUNTER — Ambulatory Visit: Admitting: Sports Medicine

## 2023-09-21 ENCOUNTER — Ambulatory Visit (INDEPENDENT_AMBULATORY_CARE_PROVIDER_SITE_OTHER): Admitting: Sports Medicine

## 2023-09-21 DIAGNOSIS — M84374G Stress fracture, right foot, subsequent encounter for fracture with delayed healing: Secondary | ICD-10-CM

## 2023-09-21 DIAGNOSIS — S52514D Nondisplaced fracture of right radial styloid process, subsequent encounter for closed fracture with routine healing: Secondary | ICD-10-CM | POA: Diagnosis not present

## 2023-09-21 NOTE — Progress Notes (Signed)
    Procedures performed today:    None.  Independent interpretation of notes and tests performed by another provider:   None.  Brief History, Exam, Impression, and Recommendations:    Closed fracture of radial styloid, right 3 to 4 weeks status post fracture, she has been intermittently wearing the Exos spica, currently she has no tenderness over the fracture, good motion, good strength, she may discontinue bracing, limit lifting to 5 to 10 pounds for the next 3 weeks and then return as needed.  Stress fracture of metatarsal bone of right foot Healed, asymptomatic, able to jump up and down on the affected leg, return as needed.    ____________________________________________ Joselyn Nicely. Sandy Crumb, M.D., ABFM., CAQSM., AME. Primary Care and Sports Medicine [place] [facility]  Adjunct Professor of [facility] Medicine  [employer]  Restaurant manager, fast food

## 2023-09-21 NOTE — Assessment & Plan Note (Signed)
 Healed, asymptomatic, able to jump up and down on the affected leg, return as needed.

## 2023-09-21 NOTE — Assessment & Plan Note (Signed)
 3 to 4 weeks status post fracture, she has been intermittently wearing the Exos spica, currently she has no tenderness over the fracture, good motion, good strength, she may discontinue bracing, limit lifting to 5 to 10 pounds for the next 3 weeks and then return as needed.

## 2023-11-26 ENCOUNTER — Encounter: Payer: Self-pay | Admitting: Family Medicine

## 2024-01-10 ENCOUNTER — Encounter: Payer: Self-pay | Admitting: Sports Medicine
